# Patient Record
Sex: Female | Born: 1935 | Race: White | Hispanic: No
Health system: Southern US, Community
[De-identification: ages and names within clinical notes are randomized; demographics above are authoritative.]

---

## 2014-12-09 DIAGNOSIS — Z0000 Encounter for general adult medical examination without abnormal findings: Secondary | ICD-10-CM | POA: Diagnosis not present

## 2014-12-09 DIAGNOSIS — M8589 Other specified disorders of bone density and structure, multiple sites: Secondary | ICD-10-CM | POA: Diagnosis not present

## 2014-12-09 DIAGNOSIS — I10 Essential (primary) hypertension: Secondary | ICD-10-CM | POA: Diagnosis not present

## 2014-12-09 DIAGNOSIS — I70219 Atherosclerosis of native arteries of extremities with intermittent claudication, unspecified extremity: Secondary | ICD-10-CM | POA: Diagnosis not present

## 2015-04-04 DIAGNOSIS — D485 Neoplasm of uncertain behavior of skin: Secondary | ICD-10-CM | POA: Diagnosis not present

## 2015-04-04 DIAGNOSIS — C44529 Squamous cell carcinoma of skin of other part of trunk: Secondary | ICD-10-CM | POA: Diagnosis not present

## 2015-04-04 DIAGNOSIS — I10 Essential (primary) hypertension: Secondary | ICD-10-CM | POA: Diagnosis not present

## 2015-04-04 DIAGNOSIS — E780 Pure hypercholesterolemia: Secondary | ICD-10-CM | POA: Diagnosis not present

## 2015-04-11 DIAGNOSIS — H5210 Myopia, unspecified eye: Secondary | ICD-10-CM | POA: Diagnosis not present

## 2015-04-11 DIAGNOSIS — H524 Presbyopia: Secondary | ICD-10-CM | POA: Diagnosis not present

## 2015-04-11 DIAGNOSIS — H25019 Cortical age-related cataract, unspecified eye: Secondary | ICD-10-CM | POA: Diagnosis not present

## 2015-04-13 DIAGNOSIS — C44529 Squamous cell carcinoma of skin of other part of trunk: Secondary | ICD-10-CM | POA: Diagnosis not present

## 2015-04-13 DIAGNOSIS — L820 Inflamed seborrheic keratosis: Secondary | ICD-10-CM | POA: Diagnosis not present

## 2015-04-13 DIAGNOSIS — C44319 Basal cell carcinoma of skin of other parts of face: Secondary | ICD-10-CM | POA: Diagnosis not present

## 2015-04-13 DIAGNOSIS — L570 Actinic keratosis: Secondary | ICD-10-CM | POA: Diagnosis not present

## 2015-04-13 DIAGNOSIS — D485 Neoplasm of uncertain behavior of skin: Secondary | ICD-10-CM | POA: Diagnosis not present

## 2015-04-23 DIAGNOSIS — H5203 Hypermetropia, bilateral: Secondary | ICD-10-CM | POA: Diagnosis not present

## 2015-04-23 DIAGNOSIS — Z0100 Encounter for examination of eyes and vision without abnormal findings: Secondary | ICD-10-CM | POA: Diagnosis not present

## 2015-04-28 DIAGNOSIS — C44319 Basal cell carcinoma of skin of other parts of face: Secondary | ICD-10-CM | POA: Diagnosis not present

## 2015-05-05 DIAGNOSIS — L820 Inflamed seborrheic keratosis: Secondary | ICD-10-CM | POA: Diagnosis not present

## 2015-05-05 DIAGNOSIS — C44319 Basal cell carcinoma of skin of other parts of face: Secondary | ICD-10-CM | POA: Diagnosis not present

## 2015-08-25 DIAGNOSIS — I70219 Atherosclerosis of native arteries of extremities with intermittent claudication, unspecified extremity: Secondary | ICD-10-CM | POA: Diagnosis not present

## 2015-08-25 DIAGNOSIS — I4891 Unspecified atrial fibrillation: Secondary | ICD-10-CM | POA: Diagnosis not present

## 2015-08-25 DIAGNOSIS — E7800 Pure hypercholesterolemia, unspecified: Secondary | ICD-10-CM | POA: Diagnosis not present

## 2015-08-25 DIAGNOSIS — I10 Essential (primary) hypertension: Secondary | ICD-10-CM | POA: Diagnosis not present

## 2015-08-25 DIAGNOSIS — I499 Cardiac arrhythmia, unspecified: Secondary | ICD-10-CM | POA: Diagnosis not present

## 2015-08-26 DIAGNOSIS — I499 Cardiac arrhythmia, unspecified: Secondary | ICD-10-CM | POA: Diagnosis not present

## 2015-08-26 DIAGNOSIS — I4891 Unspecified atrial fibrillation: Secondary | ICD-10-CM | POA: Diagnosis not present

## 2015-08-26 DIAGNOSIS — I10 Essential (primary) hypertension: Secondary | ICD-10-CM | POA: Diagnosis not present

## 2015-08-26 DIAGNOSIS — E7800 Pure hypercholesterolemia, unspecified: Secondary | ICD-10-CM | POA: Diagnosis not present

## 2015-08-26 DIAGNOSIS — I70219 Atherosclerosis of native arteries of extremities with intermittent claudication, unspecified extremity: Secondary | ICD-10-CM | POA: Diagnosis not present

## 2015-10-21 DIAGNOSIS — H5203 Hypermetropia, bilateral: Secondary | ICD-10-CM | POA: Diagnosis not present

## 2015-10-21 DIAGNOSIS — Z0100 Encounter for examination of eyes and vision without abnormal findings: Secondary | ICD-10-CM | POA: Diagnosis not present

## 2015-11-09 DIAGNOSIS — H81312 Aural vertigo, left ear: Secondary | ICD-10-CM | POA: Diagnosis not present

## 2015-11-09 DIAGNOSIS — R42 Dizziness and giddiness: Secondary | ICD-10-CM | POA: Diagnosis not present

## 2015-12-15 DIAGNOSIS — M8589 Other specified disorders of bone density and structure, multiple sites: Secondary | ICD-10-CM | POA: Diagnosis not present

## 2015-12-15 DIAGNOSIS — Z0000 Encounter for general adult medical examination without abnormal findings: Secondary | ICD-10-CM | POA: Diagnosis not present

## 2015-12-15 DIAGNOSIS — E559 Vitamin D deficiency, unspecified: Secondary | ICD-10-CM | POA: Diagnosis not present

## 2015-12-15 DIAGNOSIS — E7800 Pure hypercholesterolemia, unspecified: Secondary | ICD-10-CM | POA: Diagnosis not present

## 2015-12-15 DIAGNOSIS — I739 Peripheral vascular disease, unspecified: Secondary | ICD-10-CM | POA: Diagnosis not present

## 2015-12-15 DIAGNOSIS — I4891 Unspecified atrial fibrillation: Secondary | ICD-10-CM | POA: Diagnosis not present

## 2015-12-15 DIAGNOSIS — I10 Essential (primary) hypertension: Secondary | ICD-10-CM | POA: Diagnosis not present

## 2016-04-05 DIAGNOSIS — I10 Essential (primary) hypertension: Secondary | ICD-10-CM | POA: Diagnosis not present

## 2016-04-05 DIAGNOSIS — I4891 Unspecified atrial fibrillation: Secondary | ICD-10-CM | POA: Diagnosis not present

## 2016-04-05 DIAGNOSIS — E7800 Pure hypercholesterolemia, unspecified: Secondary | ICD-10-CM | POA: Diagnosis not present

## 2016-04-05 DIAGNOSIS — E559 Vitamin D deficiency, unspecified: Secondary | ICD-10-CM | POA: Diagnosis not present

## 2016-04-10 DIAGNOSIS — R030 Elevated blood-pressure reading, without diagnosis of hypertension: Secondary | ICD-10-CM | POA: Diagnosis not present

## 2016-04-10 DIAGNOSIS — R040 Epistaxis: Secondary | ICD-10-CM | POA: Diagnosis not present

## 2016-04-10 DIAGNOSIS — I10 Essential (primary) hypertension: Secondary | ICD-10-CM | POA: Diagnosis not present

## 2016-04-13 DIAGNOSIS — R040 Epistaxis: Secondary | ICD-10-CM | POA: Diagnosis not present

## 2016-04-13 DIAGNOSIS — I10 Essential (primary) hypertension: Secondary | ICD-10-CM | POA: Diagnosis not present

## 2016-08-06 DIAGNOSIS — E7800 Pure hypercholesterolemia, unspecified: Secondary | ICD-10-CM | POA: Diagnosis not present

## 2016-08-06 DIAGNOSIS — I10 Essential (primary) hypertension: Secondary | ICD-10-CM | POA: Diagnosis not present

## 2016-08-06 DIAGNOSIS — Z79899 Other long term (current) drug therapy: Secondary | ICD-10-CM | POA: Diagnosis not present

## 2016-08-06 DIAGNOSIS — I4891 Unspecified atrial fibrillation: Secondary | ICD-10-CM | POA: Diagnosis not present

## 2016-12-31 DIAGNOSIS — H5203 Hypermetropia, bilateral: Secondary | ICD-10-CM | POA: Diagnosis not present

## 2017-01-16 DIAGNOSIS — I70203 Unspecified atherosclerosis of native arteries of extremities, bilateral legs: Secondary | ICD-10-CM | POA: Diagnosis not present

## 2017-01-16 DIAGNOSIS — E7800 Pure hypercholesterolemia, unspecified: Secondary | ICD-10-CM | POA: Diagnosis not present

## 2017-01-16 DIAGNOSIS — Z0000 Encounter for general adult medical examination without abnormal findings: Secondary | ICD-10-CM | POA: Diagnosis not present

## 2017-01-16 DIAGNOSIS — I4891 Unspecified atrial fibrillation: Secondary | ICD-10-CM | POA: Diagnosis not present

## 2017-01-16 DIAGNOSIS — Z79899 Other long term (current) drug therapy: Secondary | ICD-10-CM | POA: Diagnosis not present

## 2017-01-16 DIAGNOSIS — M8589 Other specified disorders of bone density and structure, multiple sites: Secondary | ICD-10-CM | POA: Diagnosis not present

## 2017-01-16 DIAGNOSIS — I10 Essential (primary) hypertension: Secondary | ICD-10-CM | POA: Diagnosis not present

## 2017-01-16 DIAGNOSIS — E559 Vitamin D deficiency, unspecified: Secondary | ICD-10-CM | POA: Diagnosis not present

## 2017-01-16 DIAGNOSIS — I739 Peripheral vascular disease, unspecified: Secondary | ICD-10-CM | POA: Diagnosis not present

## 2017-02-01 DIAGNOSIS — Z780 Asymptomatic menopausal state: Secondary | ICD-10-CM | POA: Diagnosis not present

## 2017-02-01 DIAGNOSIS — M85851 Other specified disorders of bone density and structure, right thigh: Secondary | ICD-10-CM | POA: Diagnosis not present

## 2017-08-27 DIAGNOSIS — I10 Essential (primary) hypertension: Secondary | ICD-10-CM | POA: Diagnosis not present

## 2017-08-27 DIAGNOSIS — I4891 Unspecified atrial fibrillation: Secondary | ICD-10-CM | POA: Diagnosis not present

## 2017-08-27 DIAGNOSIS — E7800 Pure hypercholesterolemia, unspecified: Secondary | ICD-10-CM | POA: Diagnosis not present

## 2017-08-27 DIAGNOSIS — Z79899 Other long term (current) drug therapy: Secondary | ICD-10-CM | POA: Diagnosis not present

## 2017-12-26 DIAGNOSIS — H524 Presbyopia: Secondary | ICD-10-CM | POA: Diagnosis not present

## 2017-12-26 DIAGNOSIS — H5203 Hypermetropia, bilateral: Secondary | ICD-10-CM | POA: Diagnosis not present

## 2018-05-20 DIAGNOSIS — G4700 Insomnia, unspecified: Secondary | ICD-10-CM | POA: Diagnosis not present

## 2018-05-20 DIAGNOSIS — I10 Essential (primary) hypertension: Secondary | ICD-10-CM | POA: Diagnosis not present

## 2018-05-20 DIAGNOSIS — E7800 Pure hypercholesterolemia, unspecified: Secondary | ICD-10-CM | POA: Diagnosis not present

## 2018-05-20 DIAGNOSIS — I4891 Unspecified atrial fibrillation: Secondary | ICD-10-CM | POA: Diagnosis not present

## 2018-05-20 DIAGNOSIS — Z23 Encounter for immunization: Secondary | ICD-10-CM | POA: Diagnosis not present

## 2018-07-02 DIAGNOSIS — Z0000 Encounter for general adult medical examination without abnormal findings: Secondary | ICD-10-CM | POA: Diagnosis not present

## 2018-07-02 DIAGNOSIS — Z2821 Immunization not carried out because of patient refusal: Secondary | ICD-10-CM | POA: Diagnosis not present

## 2018-07-02 DIAGNOSIS — I10 Essential (primary) hypertension: Secondary | ICD-10-CM | POA: Diagnosis not present

## 2018-07-02 DIAGNOSIS — I4891 Unspecified atrial fibrillation: Secondary | ICD-10-CM | POA: Diagnosis not present

## 2018-07-02 DIAGNOSIS — E7800 Pure hypercholesterolemia, unspecified: Secondary | ICD-10-CM | POA: Diagnosis not present

## 2018-07-02 DIAGNOSIS — I70203 Unspecified atherosclerosis of native arteries of extremities, bilateral legs: Secondary | ICD-10-CM | POA: Diagnosis not present

## 2018-07-02 DIAGNOSIS — Z6821 Body mass index (BMI) 21.0-21.9, adult: Secondary | ICD-10-CM | POA: Diagnosis not present

## 2018-10-02 DIAGNOSIS — I10 Essential (primary) hypertension: Secondary | ICD-10-CM | POA: Diagnosis not present

## 2018-10-25 DIAGNOSIS — H5203 Hypermetropia, bilateral: Secondary | ICD-10-CM | POA: Diagnosis not present

## 2019-02-03 DIAGNOSIS — F5101 Primary insomnia: Secondary | ICD-10-CM | POA: Diagnosis not present

## 2019-03-02 DIAGNOSIS — I70203 Unspecified atherosclerosis of native arteries of extremities, bilateral legs: Secondary | ICD-10-CM | POA: Diagnosis not present

## 2019-03-02 DIAGNOSIS — E7800 Pure hypercholesterolemia, unspecified: Secondary | ICD-10-CM | POA: Diagnosis not present

## 2019-03-02 DIAGNOSIS — Z6824 Body mass index (BMI) 24.0-24.9, adult: Secondary | ICD-10-CM | POA: Diagnosis not present

## 2019-03-02 DIAGNOSIS — I10 Essential (primary) hypertension: Secondary | ICD-10-CM | POA: Diagnosis not present

## 2019-03-02 DIAGNOSIS — D492 Neoplasm of unspecified behavior of bone, soft tissue, and skin: Secondary | ICD-10-CM | POA: Diagnosis not present

## 2019-03-02 DIAGNOSIS — F5101 Primary insomnia: Secondary | ICD-10-CM | POA: Diagnosis not present

## 2019-03-02 DIAGNOSIS — Z79899 Other long term (current) drug therapy: Secondary | ICD-10-CM | POA: Diagnosis not present

## 2019-04-09 DIAGNOSIS — H5203 Hypermetropia, bilateral: Secondary | ICD-10-CM | POA: Diagnosis not present

## 2019-05-01 DIAGNOSIS — Z03818 Encounter for observation for suspected exposure to other biological agents ruled out: Secondary | ICD-10-CM | POA: Diagnosis not present

## 2019-05-01 DIAGNOSIS — K529 Noninfective gastroenteritis and colitis, unspecified: Secondary | ICD-10-CM | POA: Diagnosis not present

## 2019-05-01 DIAGNOSIS — J8410 Pulmonary fibrosis, unspecified: Secondary | ICD-10-CM | POA: Diagnosis not present

## 2019-05-01 DIAGNOSIS — N838 Other noninflammatory disorders of ovary, fallopian tube and broad ligament: Secondary | ICD-10-CM | POA: Diagnosis not present

## 2019-05-01 DIAGNOSIS — R1084 Generalized abdominal pain: Secondary | ICD-10-CM | POA: Diagnosis not present

## 2019-05-01 DIAGNOSIS — K449 Diaphragmatic hernia without obstruction or gangrene: Secondary | ICD-10-CM | POA: Diagnosis not present

## 2019-05-05 DIAGNOSIS — Z6823 Body mass index (BMI) 23.0-23.9, adult: Secondary | ICD-10-CM | POA: Diagnosis not present

## 2019-05-05 DIAGNOSIS — R1084 Generalized abdominal pain: Secondary | ICD-10-CM | POA: Diagnosis not present

## 2019-07-08 DIAGNOSIS — I10 Essential (primary) hypertension: Secondary | ICD-10-CM | POA: Diagnosis not present

## 2019-07-08 DIAGNOSIS — E559 Vitamin D deficiency, unspecified: Secondary | ICD-10-CM | POA: Diagnosis not present

## 2019-07-08 DIAGNOSIS — I4891 Unspecified atrial fibrillation: Secondary | ICD-10-CM | POA: Diagnosis not present

## 2019-07-08 DIAGNOSIS — Z0000 Encounter for general adult medical examination without abnormal findings: Secondary | ICD-10-CM | POA: Diagnosis not present

## 2019-07-08 DIAGNOSIS — Z6823 Body mass index (BMI) 23.0-23.9, adult: Secondary | ICD-10-CM | POA: Diagnosis not present

## 2019-07-08 DIAGNOSIS — E7800 Pure hypercholesterolemia, unspecified: Secondary | ICD-10-CM | POA: Diagnosis not present

## 2019-07-08 DIAGNOSIS — Z2821 Immunization not carried out because of patient refusal: Secondary | ICD-10-CM | POA: Diagnosis not present

## 2019-07-13 DIAGNOSIS — M85851 Other specified disorders of bone density and structure, right thigh: Secondary | ICD-10-CM | POA: Diagnosis not present

## 2019-07-13 DIAGNOSIS — Z7983 Long term (current) use of bisphosphonates: Secondary | ICD-10-CM | POA: Diagnosis not present

## 2019-12-21 DIAGNOSIS — Z79899 Other long term (current) drug therapy: Secondary | ICD-10-CM | POA: Diagnosis not present

## 2019-12-21 DIAGNOSIS — F5101 Primary insomnia: Secondary | ICD-10-CM | POA: Diagnosis not present

## 2019-12-21 DIAGNOSIS — I70203 Unspecified atherosclerosis of native arteries of extremities, bilateral legs: Secondary | ICD-10-CM | POA: Diagnosis not present

## 2019-12-21 DIAGNOSIS — I10 Essential (primary) hypertension: Secondary | ICD-10-CM | POA: Diagnosis not present

## 2020-01-14 DIAGNOSIS — L03031 Cellulitis of right toe: Secondary | ICD-10-CM | POA: Diagnosis not present

## 2020-01-14 DIAGNOSIS — I739 Peripheral vascular disease, unspecified: Secondary | ICD-10-CM | POA: Diagnosis not present

## 2020-01-19 DIAGNOSIS — H5203 Hypermetropia, bilateral: Secondary | ICD-10-CM | POA: Diagnosis not present

## 2020-02-11 DIAGNOSIS — H5203 Hypermetropia, bilateral: Secondary | ICD-10-CM | POA: Diagnosis not present

## 2020-06-09 DIAGNOSIS — I517 Cardiomegaly: Secondary | ICD-10-CM | POA: Diagnosis not present

## 2020-06-09 DIAGNOSIS — I480 Paroxysmal atrial fibrillation: Secondary | ICD-10-CM | POA: Diagnosis not present

## 2020-06-09 DIAGNOSIS — E876 Hypokalemia: Secondary | ICD-10-CM | POA: Diagnosis not present

## 2020-06-09 DIAGNOSIS — I25118 Atherosclerotic heart disease of native coronary artery with other forms of angina pectoris: Secondary | ICD-10-CM | POA: Diagnosis not present

## 2020-06-09 DIAGNOSIS — I352 Nonrheumatic aortic (valve) stenosis with insufficiency: Secondary | ICD-10-CM | POA: Diagnosis not present

## 2020-06-09 DIAGNOSIS — I272 Pulmonary hypertension, unspecified: Secondary | ICD-10-CM | POA: Diagnosis not present

## 2020-06-09 DIAGNOSIS — E871 Hypo-osmolality and hyponatremia: Secondary | ICD-10-CM | POA: Diagnosis not present

## 2020-06-09 DIAGNOSIS — L02415 Cutaneous abscess of right lower limb: Secondary | ICD-10-CM | POA: Diagnosis not present

## 2020-06-09 DIAGNOSIS — A419 Sepsis, unspecified organism: Secondary | ICD-10-CM | POA: Diagnosis not present

## 2020-06-09 DIAGNOSIS — R600 Localized edema: Secondary | ICD-10-CM | POA: Diagnosis not present

## 2020-06-09 DIAGNOSIS — J811 Chronic pulmonary edema: Secondary | ICD-10-CM | POA: Diagnosis not present

## 2020-06-09 DIAGNOSIS — R7989 Other specified abnormal findings of blood chemistry: Secondary | ICD-10-CM | POA: Diagnosis not present

## 2020-06-09 DIAGNOSIS — I70201 Unspecified atherosclerosis of native arteries of extremities, right leg: Secondary | ICD-10-CM | POA: Diagnosis not present

## 2020-06-09 DIAGNOSIS — I70209 Unspecified atherosclerosis of native arteries of extremities, unspecified extremity: Secondary | ICD-10-CM | POA: Diagnosis not present

## 2020-06-09 DIAGNOSIS — M7989 Other specified soft tissue disorders: Secondary | ICD-10-CM | POA: Diagnosis not present

## 2020-06-09 DIAGNOSIS — M79673 Pain in unspecified foot: Secondary | ICD-10-CM | POA: Diagnosis not present

## 2020-06-09 DIAGNOSIS — I4891 Unspecified atrial fibrillation: Secondary | ICD-10-CM | POA: Diagnosis not present

## 2020-06-09 DIAGNOSIS — M1711 Unilateral primary osteoarthritis, right knee: Secondary | ICD-10-CM | POA: Diagnosis not present

## 2020-06-09 DIAGNOSIS — R0989 Other specified symptoms and signs involving the circulatory and respiratory systems: Secondary | ICD-10-CM | POA: Diagnosis not present

## 2020-06-09 DIAGNOSIS — Z79899 Other long term (current) drug therapy: Secondary | ICD-10-CM | POA: Diagnosis not present

## 2020-06-09 DIAGNOSIS — I10 Essential (primary) hypertension: Secondary | ICD-10-CM | POA: Diagnosis not present

## 2020-06-09 DIAGNOSIS — L03115 Cellulitis of right lower limb: Secondary | ICD-10-CM | POA: Diagnosis not present

## 2020-06-14 DIAGNOSIS — I480 Paroxysmal atrial fibrillation: Secondary | ICD-10-CM | POA: Diagnosis not present

## 2020-06-17 DIAGNOSIS — I739 Peripheral vascular disease, unspecified: Secondary | ICD-10-CM | POA: Diagnosis not present

## 2020-06-17 DIAGNOSIS — S8012XA Contusion of left lower leg, initial encounter: Secondary | ICD-10-CM | POA: Diagnosis not present

## 2020-06-17 DIAGNOSIS — I482 Chronic atrial fibrillation, unspecified: Secondary | ICD-10-CM | POA: Diagnosis not present

## 2020-06-21 DIAGNOSIS — S8012XA Contusion of left lower leg, initial encounter: Secondary | ICD-10-CM | POA: Diagnosis not present

## 2020-06-23 DIAGNOSIS — L97829 Non-pressure chronic ulcer of other part of left lower leg with unspecified severity: Secondary | ICD-10-CM | POA: Diagnosis not present

## 2020-06-23 DIAGNOSIS — S81812A Laceration without foreign body, left lower leg, initial encounter: Secondary | ICD-10-CM | POA: Diagnosis not present

## 2020-06-23 DIAGNOSIS — S8012XA Contusion of left lower leg, initial encounter: Secondary | ICD-10-CM | POA: Diagnosis not present

## 2020-06-23 DIAGNOSIS — D649 Anemia, unspecified: Secondary | ICD-10-CM | POA: Diagnosis not present

## 2020-06-28 DIAGNOSIS — D649 Anemia, unspecified: Secondary | ICD-10-CM | POA: Diagnosis not present

## 2020-06-28 DIAGNOSIS — D638 Anemia in other chronic diseases classified elsewhere: Secondary | ICD-10-CM | POA: Diagnosis not present

## 2020-07-01 DIAGNOSIS — D649 Anemia, unspecified: Secondary | ICD-10-CM | POA: Diagnosis not present

## 2020-07-07 DIAGNOSIS — I70229 Atherosclerosis of native arteries of extremities with rest pain, unspecified extremity: Secondary | ICD-10-CM | POA: Diagnosis not present

## 2020-07-09 DIAGNOSIS — M79671 Pain in right foot: Secondary | ICD-10-CM | POA: Diagnosis not present

## 2020-07-09 DIAGNOSIS — L819 Disorder of pigmentation, unspecified: Secondary | ICD-10-CM | POA: Diagnosis not present

## 2020-07-09 DIAGNOSIS — L03115 Cellulitis of right lower limb: Secondary | ICD-10-CM | POA: Diagnosis not present

## 2020-07-09 DIAGNOSIS — I739 Peripheral vascular disease, unspecified: Secondary | ICD-10-CM | POA: Diagnosis not present

## 2020-07-09 DIAGNOSIS — R2241 Localized swelling, mass and lump, right lower limb: Secondary | ICD-10-CM | POA: Diagnosis not present

## 2020-07-11 DIAGNOSIS — I739 Peripheral vascular disease, unspecified: Secondary | ICD-10-CM | POA: Diagnosis not present

## 2020-07-11 DIAGNOSIS — Z0000 Encounter for general adult medical examination without abnormal findings: Secondary | ICD-10-CM | POA: Diagnosis not present

## 2020-07-11 DIAGNOSIS — Z6823 Body mass index (BMI) 23.0-23.9, adult: Secondary | ICD-10-CM | POA: Diagnosis not present

## 2020-07-11 DIAGNOSIS — I482 Chronic atrial fibrillation, unspecified: Secondary | ICD-10-CM | POA: Diagnosis not present

## 2020-07-11 DIAGNOSIS — Z2821 Immunization not carried out because of patient refusal: Secondary | ICD-10-CM | POA: Diagnosis not present

## 2020-07-11 DIAGNOSIS — E7800 Pure hypercholesterolemia, unspecified: Secondary | ICD-10-CM | POA: Diagnosis not present

## 2020-07-11 DIAGNOSIS — I70203 Unspecified atherosclerosis of native arteries of extremities, bilateral legs: Secondary | ICD-10-CM | POA: Diagnosis not present

## 2020-07-11 DIAGNOSIS — I10 Essential (primary) hypertension: Secondary | ICD-10-CM | POA: Diagnosis not present

## 2020-07-11 DIAGNOSIS — G4700 Insomnia, unspecified: Secondary | ICD-10-CM | POA: Diagnosis not present

## 2020-07-12 ENCOUNTER — Encounter: Payer: Self-pay | Admitting: General Practice

## 2020-07-12 DIAGNOSIS — I70229 Atherosclerosis of native arteries of extremities with rest pain, unspecified extremity: Secondary | ICD-10-CM | POA: Diagnosis not present

## 2020-07-18 DIAGNOSIS — I4891 Unspecified atrial fibrillation: Secondary | ICD-10-CM | POA: Diagnosis not present

## 2020-07-18 DIAGNOSIS — M7989 Other specified soft tissue disorders: Secondary | ICD-10-CM | POA: Diagnosis not present

## 2020-07-18 DIAGNOSIS — Z7901 Long term (current) use of anticoagulants: Secondary | ICD-10-CM | POA: Diagnosis not present

## 2020-07-18 DIAGNOSIS — E785 Hyperlipidemia, unspecified: Secondary | ICD-10-CM | POA: Diagnosis not present

## 2020-07-18 DIAGNOSIS — Z86718 Personal history of other venous thrombosis and embolism: Secondary | ICD-10-CM | POA: Diagnosis not present

## 2020-07-18 DIAGNOSIS — I70221 Atherosclerosis of native arteries of extremities with rest pain, right leg: Secondary | ICD-10-CM | POA: Diagnosis not present

## 2020-07-18 DIAGNOSIS — I10 Essential (primary) hypertension: Secondary | ICD-10-CM | POA: Diagnosis not present

## 2020-07-18 DIAGNOSIS — J449 Chronic obstructive pulmonary disease, unspecified: Secondary | ICD-10-CM | POA: Diagnosis not present

## 2020-08-05 DIAGNOSIS — Z95820 Peripheral vascular angioplasty status with implants and grafts: Secondary | ICD-10-CM | POA: Diagnosis not present

## 2020-08-05 DIAGNOSIS — I70223 Atherosclerosis of native arteries of extremities with rest pain, bilateral legs: Secondary | ICD-10-CM | POA: Diagnosis not present

## 2020-08-05 DIAGNOSIS — I739 Peripheral vascular disease, unspecified: Secondary | ICD-10-CM | POA: Diagnosis not present

## 2020-08-09 DIAGNOSIS — I70235 Atherosclerosis of native arteries of right leg with ulceration of other part of foot: Secondary | ICD-10-CM | POA: Diagnosis not present

## 2020-08-09 DIAGNOSIS — I70219 Atherosclerosis of native arteries of extremities with intermittent claudication, unspecified extremity: Secondary | ICD-10-CM | POA: Diagnosis not present

## 2020-08-21 DIAGNOSIS — D500 Iron deficiency anemia secondary to blood loss (chronic): Secondary | ICD-10-CM | POA: Diagnosis not present

## 2020-08-21 DIAGNOSIS — K222 Esophageal obstruction: Secondary | ICD-10-CM | POA: Diagnosis not present

## 2020-08-21 DIAGNOSIS — I70261 Atherosclerosis of native arteries of extremities with gangrene, right leg: Secondary | ICD-10-CM | POA: Diagnosis not present

## 2020-08-21 DIAGNOSIS — K259 Gastric ulcer, unspecified as acute or chronic, without hemorrhage or perforation: Secondary | ICD-10-CM | POA: Diagnosis not present

## 2020-08-21 DIAGNOSIS — Z9889 Other specified postprocedural states: Secondary | ICD-10-CM | POA: Diagnosis not present

## 2020-08-21 DIAGNOSIS — M2550 Pain in unspecified joint: Secondary | ICD-10-CM | POA: Diagnosis not present

## 2020-08-21 DIAGNOSIS — K626 Ulcer of anus and rectum: Secondary | ICD-10-CM | POA: Diagnosis not present

## 2020-08-21 DIAGNOSIS — I739 Peripheral vascular disease, unspecified: Secondary | ICD-10-CM | POA: Diagnosis not present

## 2020-08-21 DIAGNOSIS — K922 Gastrointestinal hemorrhage, unspecified: Secondary | ICD-10-CM | POA: Diagnosis not present

## 2020-08-21 DIAGNOSIS — J90 Pleural effusion, not elsewhere classified: Secondary | ICD-10-CM | POA: Diagnosis not present

## 2020-08-21 DIAGNOSIS — I96 Gangrene, not elsewhere classified: Secondary | ICD-10-CM | POA: Diagnosis not present

## 2020-08-21 DIAGNOSIS — K635 Polyp of colon: Secondary | ICD-10-CM | POA: Diagnosis not present

## 2020-08-21 DIAGNOSIS — D649 Anemia, unspecified: Secondary | ICD-10-CM | POA: Diagnosis not present

## 2020-08-21 DIAGNOSIS — D62 Acute posthemorrhagic anemia: Secondary | ICD-10-CM | POA: Diagnosis not present

## 2020-08-21 DIAGNOSIS — I509 Heart failure, unspecified: Secondary | ICD-10-CM | POA: Diagnosis not present

## 2020-08-21 DIAGNOSIS — J189 Pneumonia, unspecified organism: Secondary | ICD-10-CM | POA: Diagnosis not present

## 2020-08-21 DIAGNOSIS — K5731 Diverticulosis of large intestine without perforation or abscess with bleeding: Secondary | ICD-10-CM | POA: Diagnosis not present

## 2020-08-21 DIAGNOSIS — Z7401 Bed confinement status: Secondary | ICD-10-CM | POA: Diagnosis not present

## 2020-08-21 DIAGNOSIS — I70268 Atherosclerosis of native arteries of extremities with gangrene, other extremity: Secondary | ICD-10-CM | POA: Diagnosis not present

## 2020-08-21 DIAGNOSIS — R58 Hemorrhage, not elsewhere classified: Secondary | ICD-10-CM | POA: Diagnosis not present

## 2020-08-21 DIAGNOSIS — J9601 Acute respiratory failure with hypoxia: Secondary | ICD-10-CM | POA: Diagnosis not present

## 2020-08-21 DIAGNOSIS — I10 Essential (primary) hypertension: Secondary | ICD-10-CM | POA: Diagnosis not present

## 2020-08-21 DIAGNOSIS — I959 Hypotension, unspecified: Secondary | ICD-10-CM | POA: Diagnosis not present

## 2020-08-21 DIAGNOSIS — D126 Benign neoplasm of colon, unspecified: Secondary | ICD-10-CM | POA: Diagnosis not present

## 2020-08-21 DIAGNOSIS — R531 Weakness: Secondary | ICD-10-CM | POA: Diagnosis not present

## 2020-08-21 DIAGNOSIS — K921 Melena: Secondary | ICD-10-CM | POA: Diagnosis not present

## 2020-08-21 DIAGNOSIS — E871 Hypo-osmolality and hyponatremia: Secondary | ICD-10-CM | POA: Diagnosis not present

## 2020-08-21 DIAGNOSIS — R0902 Hypoxemia: Secondary | ICD-10-CM | POA: Diagnosis not present

## 2020-08-21 DIAGNOSIS — K2970 Gastritis, unspecified, without bleeding: Secondary | ICD-10-CM | POA: Diagnosis not present

## 2020-08-21 DIAGNOSIS — I4891 Unspecified atrial fibrillation: Secondary | ICD-10-CM | POA: Diagnosis not present

## 2020-08-21 DIAGNOSIS — I70269 Atherosclerosis of native arteries of extremities with gangrene, unspecified extremity: Secondary | ICD-10-CM | POA: Diagnosis not present

## 2020-08-21 DIAGNOSIS — K5730 Diverticulosis of large intestine without perforation or abscess without bleeding: Secondary | ICD-10-CM | POA: Diagnosis not present

## 2020-08-22 DIAGNOSIS — I96 Gangrene, not elsewhere classified: Secondary | ICD-10-CM | POA: Diagnosis not present

## 2020-08-22 DIAGNOSIS — J189 Pneumonia, unspecified organism: Secondary | ICD-10-CM | POA: Diagnosis not present

## 2020-08-22 DIAGNOSIS — D62 Acute posthemorrhagic anemia: Secondary | ICD-10-CM | POA: Diagnosis not present

## 2020-08-22 DIAGNOSIS — K222 Esophageal obstruction: Secondary | ICD-10-CM | POA: Diagnosis not present

## 2020-08-22 DIAGNOSIS — K921 Melena: Secondary | ICD-10-CM | POA: Diagnosis not present

## 2020-08-22 DIAGNOSIS — D126 Benign neoplasm of colon, unspecified: Secondary | ICD-10-CM | POA: Diagnosis not present

## 2020-08-22 DIAGNOSIS — K5730 Diverticulosis of large intestine without perforation or abscess without bleeding: Secondary | ICD-10-CM | POA: Diagnosis not present

## 2020-08-22 DIAGNOSIS — K2970 Gastritis, unspecified, without bleeding: Secondary | ICD-10-CM | POA: Diagnosis not present

## 2020-08-22 DIAGNOSIS — K5731 Diverticulosis of large intestine without perforation or abscess with bleeding: Secondary | ICD-10-CM | POA: Diagnosis not present

## 2020-08-22 DIAGNOSIS — D500 Iron deficiency anemia secondary to blood loss (chronic): Secondary | ICD-10-CM | POA: Diagnosis not present

## 2020-08-23 DIAGNOSIS — I96 Gangrene, not elsewhere classified: Secondary | ICD-10-CM | POA: Diagnosis not present

## 2020-08-23 DIAGNOSIS — K259 Gastric ulcer, unspecified as acute or chronic, without hemorrhage or perforation: Secondary | ICD-10-CM | POA: Diagnosis not present

## 2020-08-23 DIAGNOSIS — D500 Iron deficiency anemia secondary to blood loss (chronic): Secondary | ICD-10-CM | POA: Diagnosis not present

## 2020-08-23 DIAGNOSIS — K5730 Diverticulosis of large intestine without perforation or abscess without bleeding: Secondary | ICD-10-CM | POA: Diagnosis not present

## 2020-08-23 DIAGNOSIS — D126 Benign neoplasm of colon, unspecified: Secondary | ICD-10-CM | POA: Diagnosis not present

## 2020-08-23 DIAGNOSIS — K921 Melena: Secondary | ICD-10-CM | POA: Diagnosis not present

## 2020-08-23 DIAGNOSIS — K2970 Gastritis, unspecified, without bleeding: Secondary | ICD-10-CM | POA: Diagnosis not present

## 2020-08-23 DIAGNOSIS — K5731 Diverticulosis of large intestine without perforation or abscess with bleeding: Secondary | ICD-10-CM | POA: Diagnosis not present

## 2020-08-23 DIAGNOSIS — I10 Essential (primary) hypertension: Secondary | ICD-10-CM | POA: Diagnosis not present

## 2020-08-23 DIAGNOSIS — J189 Pneumonia, unspecified organism: Secondary | ICD-10-CM | POA: Diagnosis not present

## 2020-08-23 DIAGNOSIS — D62 Acute posthemorrhagic anemia: Secondary | ICD-10-CM | POA: Diagnosis not present

## 2020-08-23 DIAGNOSIS — K222 Esophageal obstruction: Secondary | ICD-10-CM | POA: Diagnosis not present

## 2020-08-24 DIAGNOSIS — K2970 Gastritis, unspecified, without bleeding: Secondary | ICD-10-CM | POA: Diagnosis not present

## 2020-08-24 DIAGNOSIS — K5730 Diverticulosis of large intestine without perforation or abscess without bleeding: Secondary | ICD-10-CM | POA: Diagnosis not present

## 2020-08-24 DIAGNOSIS — D62 Acute posthemorrhagic anemia: Secondary | ICD-10-CM | POA: Diagnosis not present

## 2020-08-24 DIAGNOSIS — K921 Melena: Secondary | ICD-10-CM | POA: Diagnosis not present

## 2020-08-24 DIAGNOSIS — K222 Esophageal obstruction: Secondary | ICD-10-CM | POA: Diagnosis not present

## 2020-08-24 DIAGNOSIS — K5731 Diverticulosis of large intestine without perforation or abscess with bleeding: Secondary | ICD-10-CM | POA: Diagnosis not present

## 2020-08-24 DIAGNOSIS — I96 Gangrene, not elsewhere classified: Secondary | ICD-10-CM | POA: Diagnosis not present

## 2020-08-24 DIAGNOSIS — D126 Benign neoplasm of colon, unspecified: Secondary | ICD-10-CM | POA: Diagnosis not present

## 2020-08-24 DIAGNOSIS — D500 Iron deficiency anemia secondary to blood loss (chronic): Secondary | ICD-10-CM | POA: Diagnosis not present

## 2020-08-24 DIAGNOSIS — J189 Pneumonia, unspecified organism: Secondary | ICD-10-CM | POA: Diagnosis not present

## 2020-08-25 DIAGNOSIS — J90 Pleural effusion, not elsewhere classified: Secondary | ICD-10-CM | POA: Diagnosis not present

## 2020-08-25 DIAGNOSIS — K921 Melena: Secondary | ICD-10-CM | POA: Diagnosis not present

## 2020-08-25 DIAGNOSIS — D500 Iron deficiency anemia secondary to blood loss (chronic): Secondary | ICD-10-CM | POA: Diagnosis not present

## 2020-08-25 DIAGNOSIS — K2970 Gastritis, unspecified, without bleeding: Secondary | ICD-10-CM | POA: Diagnosis not present

## 2020-08-25 DIAGNOSIS — J189 Pneumonia, unspecified organism: Secondary | ICD-10-CM | POA: Diagnosis not present

## 2020-08-25 DIAGNOSIS — K5730 Diverticulosis of large intestine without perforation or abscess without bleeding: Secondary | ICD-10-CM | POA: Diagnosis not present

## 2020-08-25 DIAGNOSIS — K222 Esophageal obstruction: Secondary | ICD-10-CM | POA: Diagnosis not present

## 2020-08-25 DIAGNOSIS — I96 Gangrene, not elsewhere classified: Secondary | ICD-10-CM | POA: Diagnosis not present

## 2020-08-25 DIAGNOSIS — D126 Benign neoplasm of colon, unspecified: Secondary | ICD-10-CM | POA: Diagnosis not present

## 2020-08-25 DIAGNOSIS — K5731 Diverticulosis of large intestine without perforation or abscess with bleeding: Secondary | ICD-10-CM | POA: Diagnosis not present

## 2020-08-25 DIAGNOSIS — D62 Acute posthemorrhagic anemia: Secondary | ICD-10-CM | POA: Diagnosis not present

## 2020-08-26 DIAGNOSIS — K2970 Gastritis, unspecified, without bleeding: Secondary | ICD-10-CM | POA: Diagnosis not present

## 2020-08-26 DIAGNOSIS — I96 Gangrene, not elsewhere classified: Secondary | ICD-10-CM | POA: Diagnosis not present

## 2020-08-26 DIAGNOSIS — K921 Melena: Secondary | ICD-10-CM | POA: Diagnosis not present

## 2020-08-26 DIAGNOSIS — D500 Iron deficiency anemia secondary to blood loss (chronic): Secondary | ICD-10-CM | POA: Diagnosis not present

## 2020-08-26 DIAGNOSIS — K5730 Diverticulosis of large intestine without perforation or abscess without bleeding: Secondary | ICD-10-CM | POA: Diagnosis not present

## 2020-08-26 DIAGNOSIS — K5731 Diverticulosis of large intestine without perforation or abscess with bleeding: Secondary | ICD-10-CM | POA: Diagnosis not present

## 2020-08-26 DIAGNOSIS — K222 Esophageal obstruction: Secondary | ICD-10-CM | POA: Diagnosis not present

## 2020-08-26 DIAGNOSIS — D62 Acute posthemorrhagic anemia: Secondary | ICD-10-CM | POA: Diagnosis not present

## 2020-08-26 DIAGNOSIS — D126 Benign neoplasm of colon, unspecified: Secondary | ICD-10-CM | POA: Diagnosis not present

## 2020-08-26 DIAGNOSIS — J189 Pneumonia, unspecified organism: Secondary | ICD-10-CM | POA: Diagnosis not present

## 2020-08-27 DIAGNOSIS — D500 Iron deficiency anemia secondary to blood loss (chronic): Secondary | ICD-10-CM | POA: Diagnosis not present

## 2020-08-27 DIAGNOSIS — K5730 Diverticulosis of large intestine without perforation or abscess without bleeding: Secondary | ICD-10-CM | POA: Diagnosis not present

## 2020-08-27 DIAGNOSIS — K2970 Gastritis, unspecified, without bleeding: Secondary | ICD-10-CM | POA: Diagnosis not present

## 2020-08-27 DIAGNOSIS — I96 Gangrene, not elsewhere classified: Secondary | ICD-10-CM | POA: Diagnosis not present

## 2020-08-27 DIAGNOSIS — J189 Pneumonia, unspecified organism: Secondary | ICD-10-CM | POA: Diagnosis not present

## 2020-08-27 DIAGNOSIS — K921 Melena: Secondary | ICD-10-CM | POA: Diagnosis not present

## 2020-08-27 DIAGNOSIS — K222 Esophageal obstruction: Secondary | ICD-10-CM | POA: Diagnosis not present

## 2020-08-27 DIAGNOSIS — D126 Benign neoplasm of colon, unspecified: Secondary | ICD-10-CM | POA: Diagnosis not present

## 2020-08-27 DIAGNOSIS — K5731 Diverticulosis of large intestine without perforation or abscess with bleeding: Secondary | ICD-10-CM | POA: Diagnosis not present

## 2020-08-27 DIAGNOSIS — D62 Acute posthemorrhagic anemia: Secondary | ICD-10-CM | POA: Diagnosis not present

## 2020-08-28 DIAGNOSIS — D62 Acute posthemorrhagic anemia: Secondary | ICD-10-CM | POA: Diagnosis not present

## 2020-08-28 DIAGNOSIS — K5731 Diverticulosis of large intestine without perforation or abscess with bleeding: Secondary | ICD-10-CM | POA: Diagnosis not present

## 2020-08-28 DIAGNOSIS — J189 Pneumonia, unspecified organism: Secondary | ICD-10-CM | POA: Diagnosis not present

## 2020-08-29 DIAGNOSIS — K5731 Diverticulosis of large intestine without perforation or abscess with bleeding: Secondary | ICD-10-CM | POA: Diagnosis not present

## 2020-08-29 DIAGNOSIS — D62 Acute posthemorrhagic anemia: Secondary | ICD-10-CM | POA: Diagnosis not present

## 2020-08-29 DIAGNOSIS — J189 Pneumonia, unspecified organism: Secondary | ICD-10-CM | POA: Diagnosis not present

## 2020-08-30 DIAGNOSIS — D62 Acute posthemorrhagic anemia: Secondary | ICD-10-CM | POA: Diagnosis not present

## 2020-08-30 DIAGNOSIS — J189 Pneumonia, unspecified organism: Secondary | ICD-10-CM | POA: Diagnosis not present

## 2020-08-30 DIAGNOSIS — K5731 Diverticulosis of large intestine without perforation or abscess with bleeding: Secondary | ICD-10-CM | POA: Diagnosis not present

## 2020-08-31 DIAGNOSIS — D62 Acute posthemorrhagic anemia: Secondary | ICD-10-CM | POA: Diagnosis not present

## 2020-08-31 DIAGNOSIS — K5731 Diverticulosis of large intestine without perforation or abscess with bleeding: Secondary | ICD-10-CM | POA: Diagnosis not present

## 2020-08-31 DIAGNOSIS — I96 Gangrene, not elsewhere classified: Secondary | ICD-10-CM | POA: Diagnosis not present

## 2020-08-31 DIAGNOSIS — I739 Peripheral vascular disease, unspecified: Secondary | ICD-10-CM | POA: Diagnosis not present

## 2020-08-31 DIAGNOSIS — I70269 Atherosclerosis of native arteries of extremities with gangrene, unspecified extremity: Secondary | ICD-10-CM | POA: Diagnosis not present

## 2020-08-31 DIAGNOSIS — J189 Pneumonia, unspecified organism: Secondary | ICD-10-CM | POA: Diagnosis not present

## 2020-09-01 ENCOUNTER — Other Ambulatory Visit: Payer: Self-pay | Admitting: Interventional Radiology

## 2020-09-01 DIAGNOSIS — I70269 Atherosclerosis of native arteries of extremities with gangrene, unspecified extremity: Secondary | ICD-10-CM | POA: Diagnosis not present

## 2020-09-01 DIAGNOSIS — D62 Acute posthemorrhagic anemia: Secondary | ICD-10-CM | POA: Diagnosis not present

## 2020-09-01 DIAGNOSIS — J189 Pneumonia, unspecified organism: Secondary | ICD-10-CM | POA: Diagnosis not present

## 2020-09-01 DIAGNOSIS — Z9889 Other specified postprocedural states: Secondary | ICD-10-CM | POA: Diagnosis not present

## 2020-09-01 DIAGNOSIS — K5731 Diverticulosis of large intestine without perforation or abscess with bleeding: Secondary | ICD-10-CM | POA: Diagnosis not present

## 2020-09-01 DIAGNOSIS — I70261 Atherosclerosis of native arteries of extremities with gangrene, right leg: Secondary | ICD-10-CM | POA: Diagnosis not present

## 2020-09-02 DIAGNOSIS — D62 Acute posthemorrhagic anemia: Secondary | ICD-10-CM | POA: Diagnosis not present

## 2020-09-02 DIAGNOSIS — I70269 Atherosclerosis of native arteries of extremities with gangrene, unspecified extremity: Secondary | ICD-10-CM | POA: Diagnosis not present

## 2020-09-02 DIAGNOSIS — I509 Heart failure, unspecified: Secondary | ICD-10-CM | POA: Diagnosis not present

## 2020-09-02 DIAGNOSIS — K5731 Diverticulosis of large intestine without perforation or abscess with bleeding: Secondary | ICD-10-CM | POA: Diagnosis not present

## 2020-09-02 DIAGNOSIS — J189 Pneumonia, unspecified organism: Secondary | ICD-10-CM | POA: Diagnosis not present

## 2020-09-03 DIAGNOSIS — R531 Weakness: Secondary | ICD-10-CM | POA: Diagnosis not present

## 2020-09-03 DIAGNOSIS — R58 Hemorrhage, not elsewhere classified: Secondary | ICD-10-CM | POA: Diagnosis not present

## 2020-09-03 DIAGNOSIS — M2550 Pain in unspecified joint: Secondary | ICD-10-CM | POA: Diagnosis not present

## 2020-09-03 DIAGNOSIS — Z7401 Bed confinement status: Secondary | ICD-10-CM | POA: Diagnosis not present

## 2020-09-07 ENCOUNTER — Other Ambulatory Visit (HOSPITAL_COMMUNITY): Payer: Self-pay | Admitting: Interventional Radiology

## 2020-09-07 DIAGNOSIS — I96 Gangrene, not elsewhere classified: Secondary | ICD-10-CM

## 2020-09-08 ENCOUNTER — Other Ambulatory Visit: Payer: Self-pay | Admitting: Radiology

## 2020-09-08 DIAGNOSIS — E871 Hypo-osmolality and hyponatremia: Secondary | ICD-10-CM | POA: Diagnosis not present

## 2020-09-08 DIAGNOSIS — F1190 Opioid use, unspecified, uncomplicated: Secondary | ICD-10-CM | POA: Diagnosis not present

## 2020-09-08 DIAGNOSIS — I70223 Atherosclerosis of native arteries of extremities with rest pain, bilateral legs: Secondary | ICD-10-CM | POA: Diagnosis not present

## 2020-09-08 DIAGNOSIS — I96 Gangrene, not elsewhere classified: Secondary | ICD-10-CM | POA: Diagnosis not present

## 2020-09-08 DIAGNOSIS — R600 Localized edema: Secondary | ICD-10-CM | POA: Diagnosis not present

## 2020-09-08 DIAGNOSIS — I70229 Atherosclerosis of native arteries of extremities with rest pain, unspecified extremity: Secondary | ICD-10-CM | POA: Diagnosis not present

## 2020-09-09 ENCOUNTER — Ambulatory Visit (HOSPITAL_COMMUNITY): Admission: RE | Admit: 2020-09-09 | Payer: Medicare HMO | Source: Ambulatory Visit

## 2020-09-13 DIAGNOSIS — R700 Elevated erythrocyte sedimentation rate: Secondary | ICD-10-CM | POA: Diagnosis not present

## 2020-09-13 DIAGNOSIS — R600 Localized edema: Secondary | ICD-10-CM | POA: Diagnosis not present

## 2020-09-13 DIAGNOSIS — D649 Anemia, unspecified: Secondary | ICD-10-CM | POA: Diagnosis not present

## 2020-09-15 ENCOUNTER — Other Ambulatory Visit: Payer: Self-pay | Admitting: Radiology

## 2020-09-16 ENCOUNTER — Telehealth (HOSPITAL_COMMUNITY): Payer: Self-pay | Admitting: *Deleted

## 2020-09-16 ENCOUNTER — Other Ambulatory Visit: Payer: Self-pay

## 2020-09-16 ENCOUNTER — Ambulatory Visit (HOSPITAL_COMMUNITY)
Admission: RE | Admit: 2020-09-16 | Discharge: 2020-09-16 | Disposition: A | Discharge status: Home / Self Care | Payer: Medicare HMO | Source: Ambulatory Visit | Attending: Interventional Radiology | Admitting: Interventional Radiology

## 2020-09-16 ENCOUNTER — Other Ambulatory Visit (HOSPITAL_COMMUNITY): Payer: Self-pay | Admitting: Interventional Radiology

## 2020-09-16 DIAGNOSIS — I7092 Chronic total occlusion of artery of the extremities: Secondary | ICD-10-CM | POA: Insufficient documentation

## 2020-09-16 DIAGNOSIS — L97519 Non-pressure chronic ulcer of other part of right foot with unspecified severity: Secondary | ICD-10-CM | POA: Diagnosis not present

## 2020-09-16 DIAGNOSIS — I70261 Atherosclerosis of native arteries of extremities with gangrene, right leg: Secondary | ICD-10-CM | POA: Insufficient documentation

## 2020-09-16 DIAGNOSIS — Z79899 Other long term (current) drug therapy: Secondary | ICD-10-CM | POA: Insufficient documentation

## 2020-09-16 DIAGNOSIS — I96 Gangrene, not elsewhere classified: Secondary | ICD-10-CM

## 2020-09-16 DIAGNOSIS — J449 Chronic obstructive pulmonary disease, unspecified: Secondary | ICD-10-CM | POA: Diagnosis not present

## 2020-09-16 DIAGNOSIS — I10 Essential (primary) hypertension: Secondary | ICD-10-CM | POA: Diagnosis not present

## 2020-09-16 DIAGNOSIS — I4891 Unspecified atrial fibrillation: Secondary | ICD-10-CM | POA: Insufficient documentation

## 2020-09-16 DIAGNOSIS — E785 Hyperlipidemia, unspecified: Secondary | ICD-10-CM | POA: Insufficient documentation

## 2020-09-16 HISTORY — PX: IR TIB-PERO ART PTA MOD SED: IMG2313

## 2020-09-16 HISTORY — PX: IR US GUIDE VASC ACCESS RIGHT: IMG2390

## 2020-09-16 HISTORY — PX: IR ANGIOGRAM EXTREMITY RIGHT: IMG652

## 2020-09-16 HISTORY — PX: IR ANGIOGRAM PELVIS SELECTIVE OR SUPRASELECTIVE: IMG661

## 2020-09-16 HISTORY — PX: IR US GUIDE VASC ACCESS LEFT: IMG2389

## 2020-09-16 HISTORY — PX: IR FEM POP ART STENT INC PTA MOD SED: IMG2311

## 2020-09-16 HISTORY — PX: IR FEM POP ART PTA MOD SED: IMG2309

## 2020-09-16 LAB — PROTIME-INR
INR: 1 (ref 0.8–1.2)
Prothrombin Time: 12.8 seconds (ref 11.4–15.2)

## 2020-09-16 LAB — BASIC METABOLIC PANEL
Anion gap: 14 (ref 5–15)
BUN: 11 mg/dL (ref 8–23)
CO2: 26 mmol/L (ref 22–32)
Calcium: 9.7 mg/dL (ref 8.9–10.3)
Chloride: 91 mmol/L — ABNORMAL LOW (ref 98–111)
Creatinine, Ser: 1.2 mg/dL — ABNORMAL HIGH (ref 0.44–1.00)
GFR, Estimated: 42 mL/min — ABNORMAL LOW (ref >60)
Glucose, Bld: 95 mg/dL (ref 70–99)
Potassium: 4.7 mmol/L (ref 3.5–5.1)
Sodium: 131 mmol/L — ABNORMAL LOW (ref 135–145)

## 2020-09-16 LAB — POCT ACTIVATED CLOTTING TIME
Activated Clotting Time: 214 seconds
Activated Clotting Time: 214 seconds

## 2020-09-16 LAB — CBC
HCT: 26.6 % — ABNORMAL LOW (ref 36.0–46.0)
Hemoglobin: 8.6 g/dL — ABNORMAL LOW (ref 12.0–15.0)
MCH: 30.5 pg (ref 26.0–34.0)
MCHC: 32.3 g/dL (ref 30.0–36.0)
MCV: 94.3 fL (ref 80.0–100.0)
Platelets: 405 10*3/uL — ABNORMAL HIGH (ref 150–400)
RBC: 2.82 MIL/uL — ABNORMAL LOW (ref 3.87–5.11)
RDW: 16.2 % — ABNORMAL HIGH (ref 11.5–15.5)
WBC: 9.5 10*3/uL (ref 4.0–10.5)
nRBC: 0 % (ref 0.0–0.2)

## 2020-09-16 MED ORDER — HEPARIN SODIUM (PORCINE) 1000 UNIT/ML IJ SOLN
INTRAMUSCULAR | Status: AC | PRN
  Administered 2020-09-16: 8000 [IU] via INTRAVENOUS
  Administered 2020-09-16: 3000 [IU] via INTRAVENOUS

## 2020-09-16 MED ORDER — IODIXANOL 320 MG/ML IV SOLN
100.0000 mL | Freq: Once | INTRAVENOUS | Status: AC | PRN
  Administered 2020-09-16: 50 mL via INTRA_ARTERIAL

## 2020-09-16 MED ORDER — IOHEXOL 240 MG/ML SOLN
INTRAMUSCULAR | Status: AC
  Filled 2020-09-16: qty 100

## 2020-09-16 MED ORDER — FENTANYL CITRATE (PF) 100 MCG/2ML IJ SOLN
INTRAMUSCULAR | Status: AC
  Filled 2020-09-16: qty 2

## 2020-09-16 MED ORDER — HEPARIN SODIUM (PORCINE) 1000 UNIT/ML IJ SOLN
INTRAMUSCULAR | Status: AC
  Filled 2020-09-16: qty 1

## 2020-09-16 MED ORDER — ASPIRIN 325 MG PO TABS
ORAL_TABLET | ORAL | Status: AC | PRN
  Administered 2020-09-16: 650 mg via ORAL

## 2020-09-16 MED ORDER — ASPIRIN EC 325 MG PO TBEC
DELAYED_RELEASE_TABLET | ORAL | Status: AC
  Filled 2020-09-16: qty 2

## 2020-09-16 MED ORDER — FENTANYL CITRATE (PF) 100 MCG/2ML IJ SOLN
INTRAMUSCULAR | Status: AC | PRN
  Administered 2020-09-16 (×2): 25 ug via INTRAVENOUS

## 2020-09-16 MED ORDER — MIDAZOLAM HCL 2 MG/2ML IJ SOLN
INTRAMUSCULAR | Status: AC | PRN
  Administered 2020-09-16: 0.5 mg via INTRAVENOUS

## 2020-09-16 MED ORDER — MIDAZOLAM HCL 2 MG/2ML IJ SOLN
INTRAMUSCULAR | Status: AC
  Filled 2020-09-16: qty 2

## 2020-09-16 MED ORDER — CEFAZOLIN SODIUM-DEXTROSE 2-4 GM/100ML-% IV SOLN
INTRAVENOUS | Status: AC
  Filled 2020-09-16: qty 100

## 2020-09-16 MED ORDER — LIDOCAINE HCL 1 % IJ SOLN
INTRAMUSCULAR | Status: AC
  Filled 2020-09-16: qty 20

## 2020-09-16 MED ORDER — CEFAZOLIN (ANCEF) 1 G IV SOLR
INTRAVENOUS | Status: AC | PRN
  Administered 2020-09-16: 2 g

## 2020-09-16 MED ORDER — SODIUM CHLORIDE 0.9 % IV SOLN: INTRAVENOUS | Status: AC

## 2020-09-16 MED ORDER — IODIXANOL 320 MG/ML IV SOLN
100.0000 mL | Freq: Once | INTRAVENOUS | Status: AC | PRN
  Administered 2020-09-16: 65 mL via INTRA_ARTERIAL

## 2020-09-16 MED ORDER — SODIUM CHLORIDE 0.9 % IV SOLN: INTRAVENOUS | Status: DC

## 2020-09-16 NOTE — H&P (Addendum)
Referring Physician(s): Penner,P  Supervising Physician: Corrie Mckusick  Patient Status:  [facility] OP   Chief Complaint:  Right foot pain/wound/gangrene of toes  Subjective: Pt familiar to IR service from consultation with Dr. Serafina Royals at [facility] on 09/01/20 to discuss treatment options for RLE ischemic changes. She is a 87 year old female with past medical history significant for atrial fibrillation, COPD, HLD and hypertension who was recently admitted to [facility] for weakness, anemia and lower GI bleed complicated by pneumonia.  She has since been discharged.  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  She denies left lower extremity pain.  She was evaluated by Dr. Radene Knee with vascular surgery at [facility] and underwent right lower extremity angiogram with right common iliac artery stent placement on 07/18/2020.  Prior to her admission at [facility] she was taking aspirin and Plavix which was held due to prior GI bleed.  She has intolerance to Eliquis and Xarelto saying that they make her feel sick.  Following discussions with Dr. Serafina Royals she now presents for additional right lower extremity arteriogram with possible endovascular intervention to hopefully improve inflow to right foot.  She currently denies fever, headache, chest pain, worsening dyspnea, cough, abdominal pain, back pain, nausea, vomiting or any bleeding since her discharge from out of hospital.  She is currently on no blood thinners at this time.      Allergies: Eliquis [apixaban] and Plavix [clopidogrel]  Medications: Prior to Admission medications   Medication Sig Start Date End Date Taking? Authorizing Provider  acetaminophen (TYLENOL) 500 MG tablet Take 500 mg by mouth daily.   Yes [provider]  amLODipine (NORVASC) 2.5 MG tablet Take 2.5 mg by mouth daily.    Yes [provider]  atorvastatin (LIPITOR) 40 MG tablet Take 40 mg by mouth daily.   Yes [provider]  buprenorphine (BUTRANS) 10 MCG/HR [place] 1 patch onto the skin every Friday.   Yes [provider]  Carboxymethylcellul-Glycerin (LUBRICATING EYE DROPS OP) Place 1 drop into both eyes daily.   Yes [provider]  ferrous sulfate 325 (65 FE) MG EC tablet Take 325 mg by mouth 3 (three) times daily.   Yes [provider]  furosemide (LASIX) 40 MG tablet Take 80 mg by mouth daily.   Yes [provider]  hydrochlorothiazide (HYDRODIURIL) 25 MG tablet Take 25 mg by mouth daily.   Yes [provider]  lisinopril (ZESTRIL) 40 MG tablet Take 40 mg by mouth daily.   Yes [provider]  Naphazoline-Glycerin (REDNESS RELIEF OP) Place 1 drop into both eyes daily as needed (redness).   Yes [provider]  oxyCODONE (OXY IR/ROXICODONE) 5 MG immediate release tablet Take 5-10 mg by mouth every 4 (four) hours as needed for severe pain.   Yes [provider]  potassium chloride SA (KLOR-CON) 20 MEQ tablet Take 20 mEq by mouth daily.   Yes [provider]  sodium chloride (OCEAN) 0.65 % SOLN nasal spray Place 1 spray into both nostrils as needed for congestion.   Yes [provider]     Vital Signs: BP (!) 126/59   Pulse (!) 113   Temp 98.5 F (36.9 C) (Oral)   Ht 5' 6.75" (1.695 m)   Wt 139 lb (63 kg)   SpO2 98%   BMI 21.93 kg/m  Physical Exam awake, alert.  Chest clear to auscultation bilaterally.  Heart with irregularly irregular rhythm.  Abdomen soft, positive bowel sounds, nontender; 3+ edema bilateral lower extremities, erythema of lower right leg and foot with dry gangrene noted on right first through fourth toes in addition to focus of gangrene along the medial aspect of the first MP joint.  Skin breakdown about the dorsal aspect of the right heel.   Imaging: No results  found.  Labs:  CBC: No results for input(s): WBC, HGB, HCT, PLT in the last 8760 hours.  COAGS: No results for input(s): INR, APTT in the last 8760 hours.  BMP: No results for input(s): NA, K, CL, CO2, GLUCOSE, BUN, CALCIUM, CREATININE, GFRNONAA, GFRAA in the last 8760 hours.  Invalid input(s): CMP  LIVER FUNCTION TESTS: No results for input(s): BILITOT, AST, ALT, ALKPHOS, PROT, ALBUMIN in the last 8760 hours.  Assessment and Plan: 87 year old female with past medical history significant for atrial fibrillation,COPD, HLD and hypertension who was recently admitted to [facility] for weakness, anemia and lower GI bleed complicated by pneumonia.  She has since been discharged.  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  She denies left lower extremity pain.  She was evaluated by Dr. Radene Knee with vascular surgery at [facility] and underwent right lower extremity angiogram with right common iliac artery stent placement on 07/18/2020.  Prior to her admission at [facility] she was taking aspirin and Plavix which was held due to prior GI bleed.  She has intolerance to Eliquis and Xarelto saying that they make her feel sick.  Following discussions with Dr. Serafina Royals she now presents for additional right lower extremity arteriogram with possible endovascular intervention to hopefully improve inflow to right foot.  Risks and benefits of procedure were discussed with the patient including, but not limited to bleeding, infection, vascular injury or contrast induced renal failure.  This interventional procedure involves the use of X-rays and because of the nature of the planned procedure, it is possible that we will have prolonged use of X-ray fluoroscopy.  Potential radiation risks to you include (but are not limited to) the following: - A slightly elevated risk for  cancer  several years later in life. This risk is typically less than 0.5% percent. This risk is low in comparison to the normal incidence of human cancer, which is 33% for women and 50% for men according to the [employer]. - Radiation induced injury can include skin redness, resembling a rash, tissue breakdown / ulcers and hair loss (which can be temporary or permanent).   The likelihood of either of these occurring depends on the difficulty of the procedure and whether you are sensitive to radiation due to previous procedures, disease, or genetic conditions.   IF your procedure requires a prolonged use of radiation, you will be notified and given written instructions for further action.  It is your responsibility to monitor the irradiated area for the 2 weeks following the procedure and to notify your physician if you are concerned that you have suffered a radiation induced injury.    All of the patient's questions were answered, patient is agreeable to proceed.  Consent signed and in chart.      Electronically Signed: D. Rowe Robert, PA-C 09/16/2020, 8:09 AM   I spent a total of 40 minutes at the the patient's bedside  AND on the patient's hospital floor or unit, greater than 50% of which was counseling/coordinating care for right lower extremity arteriogram with possible endovascular intervention

## 2020-09-16 NOTE — Discharge Instructions (Addendum)
Angiogram, Care After This sheet gives you information about how to care for yourself after your procedure. Your doctor may also give you more specific instructions. If you have problems or questions, contact your doctor. What can I expect after the procedure? After the procedure, it is common to have these problems at the point where the catheter was inserted:  Bruising.  Tenderness.  A collection of blood (hematoma). This may feel like a small lump under the skin at the insertion site. Follow these instructions at home: Insertion site care  Follow instructions from your doctor about how to take care of the area where the catheter was inserted. Make sure you: ? Wash your hands with soap and water before you change your bandage (dressing). If you cannot use soap and water, use hand sanitizer. ? Change your bandage as told by your doctor.  Do not take baths, swim, or use a hot tub until your doctor says it is okay.  You may shower 24-48 hours after the procedure, or as told by your doctor. To clean the area: ? Gently wash the area with plain soap and water. ? Pat the area dry with a clean towel. ? Do not rub the area. This may cause bleeding.  Check your insertion area every day for signs of infection. Check for: ? Redness, swelling, or pain. ? Fluid or blood. ? Warmth. ? Pus or a bad smell.  Do not apply powder or lotion to the area. Keep the area clean and dry.   Activity  Do not drive for 24 hours if you were given a medicine to help you relax (sedative).  Rest as told by your doctor, usually for 1-2 days.  Do not lift anything that is heavier than 10 lbs. (4.5 kg) or as told by your doctor.  If your insertion site was in your leg, try to avoid stairs for a few days.  Return to your normal activities as told by your doctor. Ask your doctor what activities are safe for you. General instructions  If the insertion area starts to bleed, lie flat and put pressure on the area.  If the bleeding does not stop, get help right away. This is an emergency.  Take over-the-counter and prescription medicines only as told by your doctor.  Drink enough fluid to keep your pee (urine) pale yellow.  Keep all follow-up visits as told by your doctor. This is important.   Contact a doctor if:  You have a fever.  You have chills.  You have redness, swelling, or pain around your insertion area.  You have fluid or blood coming from your insertion area.  The insertion area feels warm to the touch.  You have pus or a bad smell coming from your insertion area.  You have more bruising around the insertion area. Get help right away if:  You have a lot of pain in the insertion area.  The insertion area swells very fast.  The insertion area is bleeding, and the bleeding does not stop after you hold steady pressure on the area.  The area around the insertion area becomes pale, cool, tingly, or numb.  You have chest pain.  You have trouble breathing.  You have a rash.  You have any signs of a stroke. "BE FAST" is an easy way to remember the main warning signs: ? B - Balance. Signs are dizziness, sudden trouble walking, or loss of balance. ? E - Eyes. Signs are trouble seeing or a change  in how you see. ? F - Face. Signs are sudden weakness or loss of feeling of the face, or the face or eyelid drooping on one side. ? A - Arms. Signs are weakness or loss of feeling in an arm. This happens suddenly and usually on one side of the body. ? S - Speech. Signs are sudden trouble speaking, slurred speech, or trouble understanding what people say. ? T - Time. Time to call emergency services. Write down what time symptoms started.  You have other signs of a stroke, such as: ? A sudden, very bad headache with no known cause. ? Feeling like you may vomit (nausea). ? Vomiting. ? A seizure. These symptoms may be an emergency. Do not wait to see if the symptoms will go away. Get  medical help right away. Call your local emergency services (911 in the [place] Do not drive yourself to the hospital. Summary  After the procedure, it is common to have bruising and tenderness at the insertion area.  Do not take baths, swim, or use a hot tub until your doctor says it is okay to do so. You may shower 24-48 hours after the procedure or as told by your doctor.  It is important to rest and drink plenty of fluids.  If the insertion area starts to bleed, lie flat and put pressure on the area. If the bleeding does not stop, get help right away. This is an emergency. This information is not intended to replace advice given to you by your health care provider. Make sure you discuss any questions you have with your health care provider. Document Revised: 06/10/2019 Document Reviewed: 06/10/2019 Elsevier Patient Education  2021 Else[employer] Inc. Moderate Conscious Sedation, Adult Sedation is the use of medicines to promote relaxation and to relieve discomfort and anxiety. Moderate conscious sedation is a type of sedation. Under moderate conscious sedation, you are less alert than normal, but you are still able to respond to instructions, touch, or both. Moderate conscious sedation is used during short medical and dental procedures. It is milder than deep sedation, which is a type of sedation under which you cannot be easily woken up. It is also milder than general anesthesia, which is the use of medicines to make you unconscious. Moderate conscious sedation allows you to return to your regular activities sooner. Tell a health care provider about:  Any allergies you have.  All medicines you are taking, including vitamins, herbs, eye drops, creams, and over-the-counter medicines.  Any use of steroids. This includes steroids taken by mouth or as a cream.  Any problems you or family members have had with sedatives and anesthetic medicines.  Any blood disorders you have.  Any surgeries you  have had.  Any medical conditions you have, such as sleep apnea.  Whether you are pregnant or may be pregnant.  Any use of cigarettes, alcohol, marijuana, or drugs. What are the risks? Generally, this is a safe procedure. However, problems may occur, including:  Getting too much medicine (oversedation).  Nausea.  Allergic reaction to medicines.  Trouble breathing. If this happens, a breathing tube may be used. It will be removed when you are awake and breathing on your own.  Heart trouble.  Lung trouble.  Confusion that gets better with time (emergence delirium). What happens before the procedure? Staying hydrated Follow instructions from your health care provider about hydration, which may include:  Up to 2 hours before the procedure - you may continue to drink clear liquids, such   as water, clear fruit juice, black coffee, and plain tea. Eating and drinking restrictions Follow instructions from your health care provider about eating and drinking, which may include:  8 hours before the procedure - stop eating heavy meals or foods, such as meat, fried foods, or fatty foods.  6 hours before the procedure - stop eating light meals or foods, such as toast or cereal.  6 hours before the procedure - stop drinking milk or drinks that contain milk.  2 hours before the procedure - stop drinking clear liquids. Medicines Ask your health care provider about:  Changing or stopping your regular medicines. This is especially important if you are taking diabetes medicines or blood thinners.  Taking medicines such as aspirin and ibuprofen. These medicines can thin your blood. Do not take these medicines unless your health care provider tells you to take them.  Taking over-the-counter medicines, vitamins, herbs, and supplements. Tests and exams  You will have a physical exam.  You may have blood tests done to show how well: ? Your kidneys and liver work. ? Your blood clots. General  instructions  Plan to have a responsible adult take you home from the hospital or clinic.  If you will be going home right after the procedure, plan to have a responsible adult care for you for the time you are told. This is important. What happens during the procedure?  You will be given the sedative. The sedative may be given: ? As a pill that you will swallow. It can also be inserted into the rectum. ? As a spray through the nose. ? As an injection into the muscle. ? As an injection into the vein through an IV.  You may be given oxygen as needed.  Your breathing, heart rate, and blood pressure will be monitored during the procedure.  The medical or dental procedure will be done. The procedure may vary among health care providers and hospitals.   What happens after the procedure?  Your blood pressure, heart rate, breathing rate, and blood oxygen level will be monitored until you leave the hospital or clinic.  You will get fluids through your IV if needed.  Do not drive or operate machinery until your health care provider says that it is safe. Summary  Sedation is the use of medicines to promote relaxation and to relieve discomfort and anxiety. Moderate conscious sedation is a type of sedation that is used during short medical and dental procedures.  Tell the health care provider about any medical conditions that you have and about all the medicines that you are taking.  You will be given the sedative as a pill, a spray through the nose, an injection into the muscle, or an injection into the vein through an IV. Vital signs are monitored during the sedation.  Moderate conscious sedation allows you to return to your regular activities sooner. This information is not intended to replace advice given to you by your health care provider. Make sure you discuss any questions you have with your health care provider. Document Revised: 12/04/2019 Document Reviewed: 07/02/2019 Elsevier  Patient Education  2021 Elsevier Inc.  

## 2020-09-16 NOTE — Procedures (Signed)
Interventional Radiology Procedure Note  Procedure:    US guided left CFA access. US guided right DP access  Balloon angioplasty and stenting of CTO of right SFA/popliteal artery, Balloon angioplasty of right AT with PTA.   Restoration of flow to the right foot via patent fem-pop and AT to the ankle.  Small vessel disease of the forefoot.  Complications: None  Recommendations:  - Start full dose aspirin daily - 4 hour recovery  - plan for DC in 4 hours when goals met - Do not submerge for 7 days - Routine care - expect right foot post-reperfusion syndrome, with swelling, pain, should improve in about 5-7 days - follow up with wound care on schedule - follow up with Dr. Earleen Newport in clinic in 4-6 weeks.  [phone]   Signed,  Dulcy Fanny Earleen Newport, DO

## 2020-09-16 NOTE — Sedation Documentation (Signed)
Handoff with short stay, left groin site level 0 with dressing clean dry and intact, right pedal site dressing is clean dry and intact. Pulses dopplerable.  6 fr angioseal deployed at 1244.

## 2020-09-17 ENCOUNTER — Emergency Department (HOSPITAL_COMMUNITY): Payer: Medicare HMO

## 2020-09-17 ENCOUNTER — Emergency Department (HOSPITAL_COMMUNITY)
Admission: EM | Admit: 2020-09-17 | Discharge: 2020-09-20 | Disposition: E | Payer: Medicare HMO | Attending: Emergency Medicine | Admitting: Emergency Medicine

## 2020-09-17 ENCOUNTER — Encounter (HOSPITAL_COMMUNITY): Payer: Self-pay | Admitting: Emergency Medicine

## 2020-09-17 DIAGNOSIS — Z20822 Contact with and (suspected) exposure to covid-19: Secondary | ICD-10-CM | POA: Insufficient documentation

## 2020-09-17 DIAGNOSIS — I10 Essential (primary) hypertension: Secondary | ICD-10-CM | POA: Insufficient documentation

## 2020-09-17 DIAGNOSIS — I469 Cardiac arrest, cause unspecified: Secondary | ICD-10-CM | POA: Insufficient documentation

## 2020-09-17 DIAGNOSIS — I517 Cardiomegaly: Secondary | ICD-10-CM | POA: Diagnosis not present

## 2020-09-17 DIAGNOSIS — K7290 Hepatic failure, unspecified without coma: Secondary | ICD-10-CM | POA: Diagnosis not present

## 2020-09-17 DIAGNOSIS — R0602 Shortness of breath: Secondary | ICD-10-CM | POA: Diagnosis present

## 2020-09-17 DIAGNOSIS — R0689 Other abnormalities of breathing: Secondary | ICD-10-CM | POA: Diagnosis not present

## 2020-09-17 DIAGNOSIS — Z79899 Other long term (current) drug therapy: Secondary | ICD-10-CM | POA: Insufficient documentation

## 2020-09-17 DIAGNOSIS — A419 Sepsis, unspecified organism: Secondary | ICD-10-CM | POA: Insufficient documentation

## 2020-09-17 DIAGNOSIS — I4891 Unspecified atrial fibrillation: Secondary | ICD-10-CM | POA: Diagnosis not present

## 2020-09-17 DIAGNOSIS — R6521 Severe sepsis with septic shock: Secondary | ICD-10-CM | POA: Insufficient documentation

## 2020-09-17 DIAGNOSIS — R4781 Slurred speech: Secondary | ICD-10-CM | POA: Diagnosis not present

## 2020-09-17 DIAGNOSIS — J9600 Acute respiratory failure, unspecified whether with hypoxia or hypercapnia: Secondary | ICD-10-CM | POA: Diagnosis not present

## 2020-09-17 DIAGNOSIS — R404 Transient alteration of awareness: Secondary | ICD-10-CM | POA: Diagnosis not present

## 2020-09-17 DIAGNOSIS — I959 Hypotension, unspecified: Secondary | ICD-10-CM | POA: Diagnosis not present

## 2020-09-17 DIAGNOSIS — R0902 Hypoxemia: Secondary | ICD-10-CM | POA: Diagnosis not present

## 2020-09-17 LAB — I-STAT VENOUS BLOOD GAS, ED
Acid-base deficit: 24 mmol/L — ABNORMAL HIGH (ref 0.0–2.0)
Bicarbonate: 5.4 mmol/L — ABNORMAL LOW (ref 20.0–28.0)
Calcium, Ion: 0.99 mmol/L — ABNORMAL LOW (ref 1.15–1.40)
HCT: 25 % — ABNORMAL LOW (ref 36.0–46.0)
Hemoglobin: 8.5 g/dL — ABNORMAL LOW (ref 12.0–15.0)
O2 Saturation: 86 %
Potassium: 5.7 mmol/L — ABNORMAL HIGH (ref 3.5–5.1)
Sodium: 132 mmol/L — ABNORMAL LOW (ref 135–145)
TCO2: 6 mmol/L — ABNORMAL LOW (ref 22–32)
pCO2, Ven: 20.5 mmHg — ABNORMAL LOW (ref 44.0–60.0)
pH, Ven: 7.03 — CL (ref 7.250–7.430)
pO2, Ven: 72 mmHg — ABNORMAL HIGH (ref 32.0–45.0)

## 2020-09-17 LAB — LACTIC ACID, PLASMA: Lactic Acid, Venous: >11 mmol/L (ref 0.5–1.9)

## 2020-09-17 LAB — COMPREHENSIVE METABOLIC PANEL
ALT: 1472 U/L — ABNORMAL HIGH (ref 0–44)
AST: 3603 U/L — ABNORMAL HIGH (ref 15–41)
Albumin: 2.3 g/dL — ABNORMAL LOW (ref 3.5–5.0)
Alkaline Phosphatase: 77 U/L (ref 38–126)
BUN: 19 mg/dL (ref 8–23)
CO2: <7 mmol/L — ABNORMAL LOW (ref 22–32)
Calcium: 8.8 mg/dL — ABNORMAL LOW (ref 8.9–10.3)
Chloride: 97 mmol/L — ABNORMAL LOW (ref 98–111)
Creatinine, Ser: 2.72 mg/dL — ABNORMAL HIGH (ref 0.44–1.00)
GFR, Estimated: 16 mL/min — ABNORMAL LOW (ref >60)
Glucose, Bld: 33 mg/dL — CL (ref 70–99)
Potassium: 5.8 mmol/L — ABNORMAL HIGH (ref 3.5–5.1)
Sodium: 134 mmol/L — ABNORMAL LOW (ref 135–145)
Total Bilirubin: 1.2 mg/dL (ref 0.3–1.2)
Total Protein: 5 g/dL — ABNORMAL LOW (ref 6.5–8.1)

## 2020-09-17 LAB — I-STAT CHEM 8, ED
BUN: 20 mg/dL (ref 8–23)
Calcium, Ion: 1 mmol/L — ABNORMAL LOW (ref 1.15–1.40)
Chloride: 102 mmol/L (ref 98–111)
Creatinine, Ser: 2.3 mg/dL — ABNORMAL HIGH (ref 0.44–1.00)
Glucose, Bld: 27 mg/dL — CL (ref 70–99)
HCT: 27 % — ABNORMAL LOW (ref 36.0–46.0)
Hemoglobin: 9.2 g/dL — ABNORMAL LOW (ref 12.0–15.0)
Potassium: 5.7 mmol/L — ABNORMAL HIGH (ref 3.5–5.1)
Sodium: 131 mmol/L — ABNORMAL LOW (ref 135–145)
TCO2: 9 mmol/L — ABNORMAL LOW (ref 22–32)

## 2020-09-17 LAB — SARS CORONAVIRUS 2 BY RT PCR (HOSPITAL ORDER, PERFORMED IN ~~LOC~~ HOSPITAL LAB): SARS Coronavirus 2: NEGATIVE

## 2020-09-17 MED ORDER — SODIUM CHLORIDE 0.9 % IV SOLN
2.0000 g | Freq: Once | INTRAVENOUS | Status: AC
  Administered 2020-09-17: 2 g via INTRAVENOUS
  Filled 2020-09-17: qty 2

## 2020-09-17 MED ORDER — SODIUM BICARBONATE 8.4 % IV SOLN
50.0000 meq | Freq: Once | INTRAVENOUS | Status: AC
  Administered 2020-09-17: 50 meq via INTRAVENOUS
  Filled 2020-09-17: qty 50

## 2020-09-17 MED ORDER — METRONIDAZOLE IN NACL 5-0.79 MG/ML-% IV SOLN
500.0000 mg | Freq: Once | INTRAVENOUS | Status: AC
  Administered 2020-09-17: 500 mg via INTRAVENOUS
  Filled 2020-09-17: qty 100

## 2020-09-17 MED ORDER — LACTATED RINGERS IV BOLUS (SEPSIS)
1000.0000 mL | Freq: Once | INTRAVENOUS | Status: AC
  Administered 2020-09-17: 1000 mL via INTRAVENOUS

## 2020-09-17 MED ORDER — DEXTROSE 50 % IV SOLN
INTRAVENOUS | Status: AC
  Filled 2020-09-17: qty 50

## 2020-09-17 MED ORDER — VANCOMYCIN VARIABLE DOSE PER UNSTABLE RENAL FUNCTION (PHARMACIST DOSING): Status: DC

## 2020-09-17 MED ORDER — VANCOMYCIN HCL IN DEXTROSE 1-5 GM/200ML-% IV SOLN: 1000.0000 mg | Freq: Once | INTRAVENOUS | Status: DC

## 2020-09-17 MED ORDER — LACTATED RINGERS IV SOLN: INTRAVENOUS | Status: DC

## 2020-09-17 MED ORDER — SODIUM CHLORIDE 0.9 % IV SOLN: 2.0000 g | INTRAVENOUS | Status: DC

## 2020-09-18 LAB — CBC WITH DIFFERENTIAL/PLATELET
Abs Immature Granulocytes: 0.6 10*3/uL — ABNORMAL HIGH (ref 0.00–0.07)
Basophils Absolute: 0 10*3/uL (ref 0.0–0.1)
Basophils Relative: 0 %
Eosinophils Absolute: 0 10*3/uL (ref 0.0–0.5)
Eosinophils Relative: 0 %
HCT: 29 % — ABNORMAL LOW (ref 36.0–46.0)
Hemoglobin: 7.6 g/dL — ABNORMAL LOW (ref 12.0–15.0)
Immature Granulocytes: 3 %
Lymphocytes Relative: 9 %
Lymphs Abs: 2.1 10*3/uL (ref 0.7–4.0)
MCH: 29.6 pg (ref 26.0–34.0)
MCHC: 26.2 g/dL — ABNORMAL LOW (ref 30.0–36.0)
MCV: 112.8 fL — ABNORMAL HIGH (ref 80.0–100.0)
Monocytes Absolute: 1.6 10*3/uL — ABNORMAL HIGH (ref 0.1–1.0)
Monocytes Relative: 6 %
Neutro Abs: 20.1 10*3/uL — ABNORMAL HIGH (ref 1.7–7.7)
Neutrophils Relative %: 82 %
Platelets: 358 10*3/uL (ref 150–400)
RBC: 2.57 MIL/uL — ABNORMAL LOW (ref 3.87–5.11)
RDW: 16.8 % — ABNORMAL HIGH (ref 11.5–15.5)
WBC: 24.4 10*3/uL — ABNORMAL HIGH (ref 4.0–10.5)
nRBC: 0 % (ref 0.0–0.2)

## 2020-09-20 NOTE — ED Notes (Signed)
Pt to morgue at this time.

## 2020-09-20 NOTE — Progress Notes (Signed)
Pharmacy Antibiotic Note  Janet Rogers is a 87 y.o. female admitted on 09/30/2020 with sepsis.  Pharmacy has been consulted for Cefepime and Vancomycin dosing.      Temp (24hrs), Avg:94.7 F (34.8 C), Min:94.7 F (34.8 C), Max:94.7 F (34.8 C)  Recent Labs  Lab 09/16/20 0807 2020-09-30 0950  WBC 9.5  --   CREATININE 1.20* 2.30*    Estimated Creatinine Clearance: 14.7 mL/min (A) (by C-G formula based on SCr of 2.3 mg/dL (H)).    Allergies  Allergen Reactions  . Eliquis [Apixaban] Nausea Only    Severe fatigue   . Plavix [Clopidogrel] Nausea Only    Severe fatigue     Antimicrobials this admission: 1/29 Cefepime >>  1/29 Vancomycin >>   Dose adjustments this admission: N/a  Microbiology results: Pending   Plan:  - Cefepime 2g IV q24h - Vancomycin 1000mg  IV x 1 dose  - Will dose Vancomycin going forward based on random levels due to patient AKI Scr 1/28 was 1.2 on 1/29 2.3 - Monitor patients renal function and urine output  - De-escalate ABX when appropriate   Thank you for allowing pharmacy to be a part of this patient's care.  Duanne Limerick PharmD. BCPS September 30, 2020 10:25 AM

## 2020-09-20 NOTE — ED Provider Notes (Signed)
[facility] EMERGENCY DEPARTMENT Provider Note   CSN: [id number] Arrival date & time: Oct 01, 2020  [id number]     History Chief Complaint  Patient presents with  . Code Sepsis    Janet Rogers is a 87 y.o. female.  Level 5 caveat secondary to acuity of condition.  87 year old female with history of peripheral vascular disease status post angioplasty and stenting of right SFA popliteal for gangrenous right lower extremity yesterday by IR.  Returned home last evening and has been less responsive.  Patient denies headache chest pain abdominal pain.  Positive shortness of breath.  The history is provided by the patient and the EMS personnel.  Shortness of Breath Severity:  Moderate Onset quality:  Gradual Timing:  Constant Progression:  Unchanged Chronicity:  New Relieved by:  Nothing Worsened by:  Nothing Ineffective treatments:  None tried Associated symptoms: no abdominal pain, no chest pain, no cough, no fever, no headaches and no vomiting   Risk factors: recent surgery        History reviewed. No pertinent past medical history. HTN, afib, pvd There are no problems to display for this patient.   Past Surgical History:  Procedure Laterality Date  . IR ANGIOGRAM EXTREMITY RIGHT  09/16/2020  . IR ANGIOGRAM PELVIS SELECTIVE OR SUPRASELECTIVE  09/16/2020  . IR FEM POP ART PTA MOD SED  09/16/2020  . IR FEM POP ART STENT INC PTA MOD SED  09/16/2020  . IR US GUIDE VASC ACCESS LEFT  09/16/2020  . IR US GUIDE VASC ACCESS RIGHT  09/16/2020     OB History   No obstetric history on file.     History reviewed. No pertinent family history.     Home Medications Prior to Admission medications   Medication Sig Start Date End Date Taking? Authorizing Provider  acetaminophen (TYLENOL) 500 MG tablet Take 500 mg by mouth daily.    [provider]  amLODipine (NORVASC) 2.5 MG tablet Take 2.5 mg by mouth daily.    [provider]  atorvastatin (LIPITOR) 40  MG tablet Take 40 mg by mouth daily.    [provider]  buprenorphine Haze Rushing) 10 MCG/HR Gould 1 patch onto the skin every Friday.    [provider]  Carboxymethylcellul-Glycerin (LUBRICATING EYE DROPS OP) Place 1 drop into both eyes daily.    [provider]  ferrous sulfate 325 (65 FE) MG EC tablet Take 325 mg by mouth 3 (three) times daily.    [provider]  furosemide (LASIX) 40 MG tablet Take 80 mg by mouth daily.    [provider]  hydrochlorothiazide (HYDRODIURIL) 25 MG tablet Take 25 mg by mouth daily.    [provider]  lisinopril (ZESTRIL) 40 MG tablet Take 40 mg by mouth daily.    [provider]  Naphazoline-Glycerin (REDNESS RELIEF OP) Place 1 drop into both eyes daily as needed (redness).    [provider]  oxyCODONE (OXY IR/ROXICODONE) 5 MG immediate release tablet Take 5-10 mg by mouth every 4 (four) hours as needed for severe pain.    [provider]  potassium chloride SA (KLOR-CON) 20 MEQ tablet Take 20 mEq by mouth daily.    [provider]  sodium chloride (OCEAN) 0.65 % SOLN nasal spray Place 1 spray into both nostrils as needed for congestion.    [provider]    Allergies    Eliquis [apixaban] and Plavix [clopidogrel]  Review of Systems   Review of Systems  Unable to perform ROS: Mental status change  Constitutional: Negative for fever.  Respiratory: Positive for shortness of breath. Negative for cough.   Cardiovascular: Negative for chest pain.  Gastrointestinal: Negative for abdominal pain and vomiting.  Neurological: Negative for headaches.    Physical Exam Updated Vital Signs BP (!) 63/53   Pulse 75   Temp (!) 94.7 F (34.8 C) (Rectal)   Resp (!) 0   Ht 5\' 6"  (1.676 m)   Wt 63 kg   SpO2 92%   BMI 22.42 kg/m   Physical Exam Vitals and nursing note reviewed.  Constitutional:      General: She is in acute distress.     Appearance: She is  well-developed and well-nourished. She is ill-appearing.  HENT:     Head: Normocephalic and atraumatic.  Eyes:     Conjunctiva/sclera: Conjunctivae normal.  Cardiovascular:     Rate and Rhythm: Normal rate. Rhythm irregular.     Heart sounds: No murmur heard.   Pulmonary:     Effort: Tachypnea and accessory muscle usage present. No respiratory distress.     Breath sounds: Examination of the right-upper field reveals rhonchi. Examination of the right-middle field reveals rhonchi. Rhonchi present.  Abdominal:     Palpations: Abdomen is soft.     Tenderness: There is no abdominal tenderness. There is no guarding or rebound.  Musculoskeletal:        General: No swelling or edema.     Cervical back: Neck supple.     Comments: She has dry gangrene of right lower extremity.  Both lower extremities are cool.  Pulses not palpable.  Left lower extremity has a wound on the lateral aspect and some weeping discharge although not grossly infected.  Skin:    General: Skin is warm and dry.  Neurological:     General: No focal deficit present.     Mental Status: She is easily aroused.     Comments: Initially could tell me her name and it was Saturday.  She is moving all extremities.  Not following commands.  Psychiatric:        Mood and Affect: Mood and affect normal.     ED Results / Procedures / Treatments   Labs (all labs ordered are listed, but only abnormal results are displayed) Labs Reviewed  LACTIC ACID, PLASMA - Abnormal; Notable for the following components:      Result Value   Lactic Acid, Venous >11.0 (*)    All other components within normal limits  COMPREHENSIVE METABOLIC PANEL - Abnormal; Notable for the following components:   Sodium 134 (*)    Potassium 5.8 (*)    Chloride 97 (*)    CO2 <7 (*)    Glucose, Bld 33 (*)    Creatinine, Ser 2.72 (*)    Calcium 8.8 (*)    Total Protein 5.0 (*)    Albumin 2.3 (*)    AST 3,603 (*)    ALT 1,472 (*)    GFR, Estimated 16 (*)     All other components within normal limits  CBC WITH DIFFERENTIAL/PLATELET - Abnormal; Notable for the following components:   WBC 24.4 (*)    RBC 2.57 (*)    Hemoglobin 7.6 (*)    HCT 29.0 (*)    MCV 112.8 (*)    MCHC 26.2 (*)    RDW 16.8 (*)    Neutro Abs 20.1 (*)    Monocytes Absolute 1.6 (*)    Abs Immature Granulocytes 0.60 (*)  All other components within normal limits  I-STAT CHEM 8, ED - Abnormal; Notable for the following components:   Sodium 131 (*)    Potassium 5.7 (*)    Creatinine, Ser 2.30 (*)    Glucose, Bld 27 (*)    Calcium, Ion 1.00 (*)    TCO2 9 (*)    Hemoglobin 9.2 (*)    HCT 27.0 (*)    All other components within normal limits  I-STAT VENOUS BLOOD GAS, ED - Abnormal; Notable for the following components:   pH, Ven 7.030 (*)    pCO2, Ven 20.5 (*)    pO2, Ven 72.0 (*)    Bicarbonate 5.4 (*)    TCO2 6 (*)    Acid-base deficit 24.0 (*)    Sodium 132 (*)    Potassium 5.7 (*)    Calcium, Ion 0.99 (*)    HCT 25.0 (*)    Hemoglobin 8.5 (*)    All other components within normal limits  SARS CORONAVIRUS 2 BY RT PCR (HOSPITAL ORDER, [facility])  CULTURE, BLOOD (ROUTINE X 2)  CULTURE, BLOOD (ROUTINE X 2)  URINE CULTURE  LACTIC ACID, PLASMA  URINALYSIS, ROUTINE W REFLEX MICROSCOPIC  I-STAT VENOUS BLOOD GAS, ED    EKG EKG Interpretation  Date/Time:  10/13/20 09:23:15 EST Ventricular Rate:  85 PR Interval:    QRS Duration: 102 QT Interval:  403 QTC Calculation: 480 R Axis:   -63 Text Interpretation: Atrial fibrillation Anteroseptal infarct, age indeterminate No old tracing to compare Confirmed by Aletta Edouard [phone]) on 2020-10-13 9:31:56 AM   Radiology IR Angiogram Extremity Right  Result Date: 09/16/2020 INDICATION: 87 year old female presents with gangrene right forefoot, for attempt at revascularization femoropopliteal and tibial disease for palliation, given the imminent amputation EXAM:  ULTRASOUND-GUIDED ACCESS LEFT COMMON FEMORAL ARTERY ULTRASOUND GUIDED ACCESS RIGHT DORSALIS PEDIS ANGIOGRAM OF THE PELVIS AND RIGHT LOWER EXTREMITY BALLOON ANGIOPLASTY AND STENT OF THE FEMOROPOPLITEAL SEGMENT, TREATING LONG SEGMENT CHRONIC TOTAL OCCLUSION BALLOON ANGIOPLASTY OF OCCLUDED ANTERIOR TIBIAL ARTERY ANGIO-SEAL FOR HEMOSTASIS MEDICATIONS: 2 g Ancef.  The antibiotic was administered during procedure 11000 units IV heparin ANESTHESIA/SEDATION: Moderate (conscious) sedation was employed during this procedure. A total of Versed 0.5 mg and Fentanyl 50 mcg was administered intravenously. Moderate Sedation Time: 165 minutes. The patient's level of consciousness and vital signs were monitored continuously by radiology nursing throughout the procedure under my direct supervision. CONTRAST:  68mL VISIPAQUE IODIXANOL 320 MG/ML IV SOLN, 54mL VISIPAQUE IODIXANOL 320 MG/ML IV SOLN FLUOROSCOPY TIME:  Fluoroscopy Time: 39 minutes 30 seconds (189 mGy). COMPLICATIONS: None PROCEDURE: Informed consent was obtained from the patient following explanation of the procedure, risks, benefits and alternatives. The patient understands, agrees and consents for the procedure. All questions were addressed. A time out was performed prior to the initiation of the procedure. Maximal barrier sterile technique utilized including caps, mask, sterile gowns, sterile gloves, large sterile drape, hand hygiene, and Betadine prep. Ultrasound survey of the left inguinal region was performed with images stored and sent to PACs, confirming patency of the vessel. 1% lidocaine was used for local anesthesia. Small stab incision was made with 11 blade scalpel. Blunt dissection was performed. A micropuncture needle was used access the left common femoral artery under ultrasound. With excellent arterial blood flow returned, and an .018 micro wire was passed through the needle, observed enter the abdominal aorta under fluoroscopy. The needle was removed, and  a micropuncture sheath was placed over the wire.  The inner dilator and wire were removed, and an 035 glide wire was advanced under fluoroscopy into the abdominal aorta. The sheath was removed and a standard 5 [place] vascular sheath was placed. The dilator was removed and the sheath was flushed. Once the sheath was in place, red marrow injection was performed, confirming a narrowing of the iliac artery. Given that this would be impeding the progress of the sheath, potentially being occlusive, 6 mm balloon angioplasty was performed. Omni Flush catheter was then advanced to the abdominal aorta. Angiogram was performed of the pelvic arteries. Combination of the Omni Flush catheter and a C2 Cobra catheter used to navigate a Glidewire into the right external iliac artery. With the Glidewire position distally, the Cobra catheter was advanced into the common femoral artery. Glidewire was removed and a rose in wire was placed. Catheter was removed. Full heparinization was performed. Six French 45 cm terumo destination sheath was placed over the bifurcation into the external iliac artery. Angiogram of the right lower extremity was performed to the proximal tibial arteries. 90 cm Navicross catheter and a Glidewire were used to engage the stump of the native SFA. Never cross catheter advanced into the mid thigh, encountered calcifications within the mid thigh. Wire was removed and injection was performed demonstrating either subintimal or extravascular position of the catheter. The never cross catheter was not able to be navigated into a more confident channel in the native SFA. Therefore we elected to puncture the anterior tibial artery and work backwards. 1% lidocaine was used to anesthetize the dorsum of the right foot. Micropuncture was used to access the dorsalis pedis. 018 V18 wire was passed retrograde through the dorsalis pedis and distal anterior tibial artery. A wire position in the distal anterior tibial artery was  achieved and the needle was removed. An 018 crossing catheter was advanced bare back, with attempt at pushing the crossing catheter and wire retrograde. A combination of first and 018 command wire and then an 014 Glidewire were used to find the anterior tibial artery channel within the proximal third of the artery. Ultimately the quick cross catheter was impeded by calcifications in the tibial artery, without enough support to push the Glidewire retrograde. Exchange was then made again for the 014 command wire. The 014 command wire entered the proximal anterior tibial artery. We then upsized the crossing catheter to a 90 cm quick cross catheter. We were then successful with the 018 wire and the 035 catheter with advancing the catheter to the popliteal artery. The goal at this point was finding a similar channel between the catheter from above and the catheter from below. An 014 wire was passed retrograde through the length of the SFA. 2 mm balloon angioplasty was then performed along the entire length of the occluded anterior tibial artery, popliteal artery, and SFA. We were unable to run day view the wire from below with the never cross catheter from above, likely because of 2 subintimal planes. Rose in wire was passed through the antegrade crossing catheter with 5 mm balloon angioplasty performed in the adductor canal. We then again attempted to rendezvous the microwire from below with the crossing catheter from above. This was unsuccessful given 2 different planes. Ultimately we push the crossing catheter from the dorsalis pedis access retrograde into the common femoral artery and the external iliac artery. We were then successfully able to retrograde pass the command wire through the never cross catheter and externalized the wire at the left common femoral access.  At this point, 5 mm balloon angioplasty was performed along the entire length of the SFA, popliteal artery. We then initiated stenting with placement  of a 5 mm x 80 mm Supera stent. This was extended proximally with a 5 mm by 100 mm viabahn graft. We finally extended this with a 6 mm by 250 mm viabahn graft. 6 mm balloon angioplasty was performed as a post dilation along the entire length of the stent graft system as well as at the proximal superficial femoral artery. At this point, an 014150 cm crossing catheter was passed distally to the anterior tibial artery and the dorsalis pedis puncture site. The 014 command wire was removed and a 300 cm whisper wire was placed antegrade through the catheter. The catheter was removed. Balloon angioplasty was then performed along the length of the anterior tibial artery with a 3 mm by 220 mm coyote balloon. Balloon catheter was removed and a final angiogram was performed of the entire right lower extremity. Pulse was checked at the right ankle confirming restoration of the pulse. We then withdrew all catheters and wires, placed a Bentson wire into the left common femoral access site and deployed a 6 [place] Angio-Seal. Patient tolerated the procedure well and remained hemodynamically stable throughout. No complications were encountered and no significant blood loss. FINDINGS: Initial angiogram confirms a right SFA/popliteal artery occlusion, with no significant flow into the tibial arteries. 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 0% flow at the beginning of the case with 100% improved perfusion at the conclusion. 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 Angio-Seal deployed for hemostasis. Signed, Dulcy Fanny. Dellia Nims,  RPVI [facility] Radiology Electronically Signed   By: Corrie Mckusick D.O.   On: 09/16/2020 17:58   IR Angiogram Pelvis Selective Or Supraselective  Result Date: 09/16/2020 INDICATION: 87 year old female presents with gangrene right forefoot, for attempt at revascularization femoropopliteal and tibial disease for palliation, given the imminent amputation EXAM: ULTRASOUND-GUIDED ACCESS LEFT COMMON FEMORAL ARTERY ULTRASOUND GUIDED ACCESS RIGHT DORSALIS PEDIS ANGIOGRAM OF THE PELVIS AND RIGHT LOWER EXTREMITY BALLOON ANGIOPLASTY AND STENT OF THE FEMOROPOPLITEAL SEGMENT, TREATING LONG SEGMENT CHRONIC TOTAL OCCLUSION BALLOON ANGIOPLASTY OF OCCLUDED ANTERIOR TIBIAL ARTERY ANGIO-SEAL FOR HEMOSTASIS MEDICATIONS: 2 g Ancef.  The antibiotic was administered during procedure 11000 units IV heparin ANESTHESIA/SEDATION: Moderate (conscious) sedation was employed during this procedure. A total of Versed 0.5 mg and Fentanyl 50 mcg was administered intravenously. Moderate Sedation Time: 165 minutes. The patient's level of consciousness and vital signs were monitored continuously by radiology nursing throughout the procedure under my direct supervision. CONTRAST:  71mL VISIPAQUE IODIXANOL 320 MG/ML IV SOLN, 95mL VISIPAQUE IODIXANOL 320 MG/ML IV SOLN FLUOROSCOPY TIME:  Fluoroscopy Time: 39 minutes 30 seconds (189 mGy). COMPLICATIONS: None PROCEDURE: Informed consent was obtained from the patient following explanation of the procedure, risks, benefits and alternatives. The patient understands, agrees and consents for the procedure. All questions were addressed. A time out was performed prior to the initiation of the procedure. Maximal barrier sterile technique utilized including caps, mask, sterile gowns, sterile gloves, large sterile drape, hand hygiene, and Betadine prep. Ultrasound survey of the  left inguinal region was performed with images stored and sent to PACs, confirming  patency of the vessel. 1% lidocaine was used for local anesthesia. Small stab incision was made with 11 blade scalpel. Blunt dissection was performed. A micropuncture needle was used access the left common femoral artery under ultrasound. With excellent arterial blood flow returned, and an .018 micro wire was passed through the needle, observed enter the abdominal aorta under fluoroscopy. The needle was removed, and a micropuncture sheath was placed over the wire. The inner dilator and wire were removed, and an 035 glide wire was advanced under fluoroscopy into the abdominal aorta. The sheath was removed and a standard 5 [place] vascular sheath was placed. The dilator was removed and the sheath was flushed. Once the sheath was in place, red marrow injection was performed, confirming a narrowing of the iliac artery. Given that this would be impeding the progress of the sheath, potentially being occlusive, 6 mm balloon angioplasty was performed. Omni Flush catheter was then advanced to the abdominal aorta. Angiogram was performed of the pelvic arteries. Combination of the Omni Flush catheter and a C2 Cobra catheter used to navigate a Glidewire into the right external iliac artery. With the Glidewire position distally, the Cobra catheter was advanced into the common femoral artery. Glidewire was removed and a rose in wire was placed. Catheter was removed. Full heparinization was performed. Six French 45 cm terumo destination sheath was placed over the bifurcation into the external iliac artery. Angiogram of the right lower extremity was performed to the proximal tibial arteries. 90 cm Navicross catheter and a Glidewire were used to engage the stump of the native SFA. Never cross catheter advanced into the mid thigh, encountered calcifications within the mid thigh. Wire was removed and injection was performed demonstrating either subintimal or extravascular position of the catheter. The never cross catheter was not able  to be navigated into a more confident channel in the native SFA. Therefore we elected to puncture the anterior tibial artery and work backwards. 1% lidocaine was used to anesthetize the dorsum of the right foot. Micropuncture was used to access the dorsalis pedis. 018 V18 wire was passed retrograde through the dorsalis pedis and distal anterior tibial artery. A wire position in the distal anterior tibial artery was achieved and the needle was removed. An 018 crossing catheter was advanced bare back, with attempt at pushing the crossing catheter and wire retrograde. A combination of first and 018 command wire and then an 014 Glidewire were used to find the anterior tibial artery channel within the proximal third of the artery. Ultimately the quick cross catheter was impeded by calcifications in the tibial artery, without enough support to push the Glidewire retrograde. Exchange was then made again for the 014 command wire. The 014 command wire entered the proximal anterior tibial artery. We then upsized the crossing catheter to a 90 cm quick cross catheter. We were then successful with the 018 wire and the 035 catheter with advancing the catheter to the popliteal artery. The goal at this point was finding a similar channel between the catheter from above and the catheter from below. An 014 wire was passed retrograde through the length of the SFA. 2 mm balloon angioplasty was then performed along the entire length of the occluded anterior tibial artery, popliteal artery, and SFA. We were unable to run day view the wire from below with the never cross catheter from above, likely because of 2 subintimal planes. Rose in wire was  passed through the antegrade crossing catheter with 5 mm balloon angioplasty performed in the adductor canal. We then again attempted to rendezvous the microwire from below with the crossing catheter from above. This was unsuccessful given 2 different planes. Ultimately we push the crossing  catheter from the dorsalis pedis access retrograde into the common femoral artery and the external iliac artery. We were then successfully able to retrograde pass the command wire through the never cross catheter and externalized the wire at the left common femoral access. At this point, 5 mm balloon angioplasty was performed along the entire length of the SFA, popliteal artery. We then initiated stenting with placement of a 5 mm x 80 mm Supera stent. This was extended proximally with a 5 mm by 100 mm viabahn graft. We finally extended this with a 6 mm by 250 mm viabahn graft. 6 mm balloon angioplasty was performed as a post dilation along the entire length of the stent graft system as well as at the proximal superficial femoral artery. At this point, an 014150 cm crossing catheter was passed distally to the anterior tibial artery and the dorsalis pedis puncture site. The 014 command wire was removed and a 300 cm whisper wire was placed antegrade through the catheter. The catheter was removed. Balloon angioplasty was then performed along the length of the anterior tibial artery with a 3 mm by 220 mm coyote balloon. Balloon catheter was removed and a final angiogram was performed of the entire right lower extremity. Pulse was checked at the right ankle confirming restoration of the pulse. We then withdrew all catheters and wires, placed a Bentson wire into the left common femoral access site and deployed a 6 [place] Angio-Seal. Patient tolerated the procedure well and remained hemodynamically stable throughout. No complications were encountered and no significant blood loss. FINDINGS: Initial angiogram confirms a right SFA/popliteal artery occlusion, with no significant flow into the tibial arteries. 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 0% flow at the beginning of the  case with 100% improved perfusion at the conclusion. 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 Angio-Seal deployed for hemostasis. Signed, Dulcy Fanny. Dellia Nims, RPVI [facility] Radiology Electronically Signed   By: Corrie Mckusick D.O.   On: 09/16/2020 17:58   IR FEM POP ART PTA MOD SED  Result Date: 09/16/2020 INDICATION: 87 year old female presents with gangrene right forefoot, for attempt at revascularization femoropopliteal and tibial disease for palliation, given the imminent amputation EXAM: ULTRASOUND-GUIDED ACCESS LEFT COMMON FEMORAL ARTERY ULTRASOUND GUIDED ACCESS RIGHT DORSALIS PEDIS ANGIOGRAM OF THE PELVIS AND RIGHT LOWER EXTREMITY BALLOON ANGIOPLASTY AND STENT OF THE FEMOROPOPLITEAL SEGMENT, TREATING LONG SEGMENT CHRONIC TOTAL OCCLUSION BALLOON ANGIOPLASTY OF OCCLUDED ANTERIOR TIBIAL ARTERY ANGIO-SEAL FOR HEMOSTASIS MEDICATIONS: 2 g Ancef.  The antibiotic was administered during procedure 11000 units IV heparin ANESTHESIA/SEDATION: Moderate (conscious) sedation was employed during this procedure. A total of Versed 0.5 mg and Fentanyl 50 mcg was administered intravenously. Moderate Sedation Time: 165 minutes. The patient's level of consciousness and vital signs were monitored continuously by radiology nursing throughout the procedure under my direct supervision. CONTRAST:  18mL VISIPAQUE IODIXANOL 320 MG/ML  IV SOLN, 34mL VISIPAQUE IODIXANOL 320 MG/ML IV SOLN FLUOROSCOPY TIME:  Fluoroscopy Time: 39 minutes 30 seconds (189 mGy). COMPLICATIONS: None PROCEDURE: Informed consent was obtained from the patient following explanation of the procedure, risks, benefits and alternatives. The patient  understands, agrees and consents for the procedure. All questions were addressed. A time out was performed prior to the initiation of the procedure. Maximal barrier sterile technique utilized including caps, mask, sterile gowns, sterile gloves, large sterile drape, hand hygiene, and Betadine prep. Ultrasound survey of the left inguinal region was performed with images stored and sent to PACs, confirming patency of the vessel. 1% lidocaine was used for local anesthesia. Small stab incision was made with 11 blade scalpel. Blunt dissection was performed. A micropuncture needle was used access the left common femoral artery under ultrasound. With excellent arterial blood flow returned, and an .018 micro wire was passed through the needle, observed enter the abdominal aorta under fluoroscopy. The needle was removed, and a micropuncture sheath was placed over the wire. The inner dilator and wire were removed, and an 035 glide wire was advanced under fluoroscopy into the abdominal aorta. The sheath was removed and a standard 5 [place] vascular sheath was placed. The dilator was removed and the sheath was flushed. Once the sheath was in place, red marrow injection was performed, confirming a narrowing of the iliac artery. Given that this would be impeding the progress of the sheath, potentially being occlusive, 6 mm balloon angioplasty was performed. Omni Flush catheter was then advanced to the abdominal aorta. Angiogram was performed of the pelvic arteries. Combination of the Omni Flush catheter and a C2 Cobra catheter used to navigate a Glidewire into the right external iliac artery. With the Glidewire position distally, the Cobra catheter was advanced into the common femoral artery. Glidewire was removed and a rose in wire was placed. Catheter was removed. Full heparinization was performed. Six French 45 cm terumo destination sheath was placed over the bifurcation into the external iliac artery. Angiogram of the right  lower extremity was performed to the proximal tibial arteries. 90 cm Navicross catheter and a Glidewire were used to engage the stump of the native SFA. Never cross catheter advanced into the mid thigh, encountered calcifications within the mid thigh. Wire was removed and injection was performed demonstrating either subintimal or extravascular position of the catheter. The never cross catheter was not able to be navigated into a more confident channel in the native SFA. Therefore we elected to puncture the anterior tibial artery and work backwards. 1% lidocaine was used to anesthetize the dorsum of the right foot. Micropuncture was used to access the dorsalis pedis. 018 V18 wire was passed retrograde through the dorsalis pedis and distal anterior tibial artery. A wire position in the distal anterior tibial artery was achieved and the needle was removed. An 018 crossing catheter was advanced bare back, with attempt at pushing the crossing catheter and wire retrograde. A combination of first and 018 command wire and then an 014 Glidewire were used to find the anterior tibial artery channel within the proximal third of the artery. Ultimately the quick cross catheter was impeded by calcifications in the tibial artery, without enough support to push the Glidewire retrograde. Exchange was then made again for the 014 command wire. The 014 command wire entered the proximal anterior tibial artery. We then upsized the crossing catheter to a 90 cm quick cross catheter. We were then successful with the 018 wire and the 035 catheter with  advancing the catheter to the popliteal artery. The goal at this point was finding a similar channel between the catheter from above and the catheter from below. An 014 wire was passed retrograde through the length of the SFA. 2 mm balloon angioplasty was then performed along the entire length of the occluded anterior tibial artery, popliteal artery, and SFA. We were unable to run day view the  wire from below with the never cross catheter from above, likely because of 2 subintimal planes. Rose in wire was passed through the antegrade crossing catheter with 5 mm balloon angioplasty performed in the adductor canal. We then again attempted to rendezvous the microwire from below with the crossing catheter from above. This was unsuccessful given 2 different planes. Ultimately we push the crossing catheter from the dorsalis pedis access retrograde into the common femoral artery and the external iliac artery. We were then successfully able to retrograde pass the command wire through the never cross catheter and externalized the wire at the left common femoral access. At this point, 5 mm balloon angioplasty was performed along the entire length of the SFA, popliteal artery. We then initiated stenting with placement of a 5 mm x 80 mm Supera stent. This was extended proximally with a 5 mm by 100 mm viabahn graft. We finally extended this with a 6 mm by 250 mm viabahn graft. 6 mm balloon angioplasty was performed as a post dilation along the entire length of the stent graft system as well as at the proximal superficial femoral artery. At this point, an 014150 cm crossing catheter was passed distally to the anterior tibial artery and the dorsalis pedis puncture site. The 014 command wire was removed and a 300 cm whisper wire was placed antegrade through the catheter. The catheter was removed. Balloon angioplasty was then performed along the length of the anterior tibial artery with a 3 mm by 220 mm coyote balloon. Balloon catheter was removed and a final angiogram was performed of the entire right lower extremity. Pulse was checked at the right ankle confirming restoration of the pulse. We then withdrew all catheters and wires, placed a Bentson wire into the left common femoral access site and deployed a 6 [place] Angio-Seal. Patient tolerated the procedure well and remained hemodynamically stable throughout. No  complications were encountered and no significant blood loss. FINDINGS: Initial angiogram confirms a right SFA/popliteal artery occlusion, with no significant flow into the tibial arteries. 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 0% flow at the beginning of the case with 100% improved perfusion at the conclusion. 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 Angio-Seal deployed for hemostasis. Signed, Dulcy Fanny. Dellia Nims, RPVI [facility] Radiology Electronically Signed   By: Corrie Mckusick D.O.   On: 09/16/2020 17:58   IR FEM POP ART STENT INC PTA MOD SED  Result Date: 09/16/2020 INDICATION: 87 year old female presents with gangrene right forefoot, for attempt at revascularization femoropopliteal and tibial disease for palliation, given the imminent amputation EXAM: ULTRASOUND-GUIDED ACCESS LEFT COMMON FEMORAL ARTERY ULTRASOUND GUIDED ACCESS RIGHT DORSALIS PEDIS ANGIOGRAM OF THE PELVIS AND RIGHT LOWER EXTREMITY BALLOON ANGIOPLASTY AND STENT  OF THE FEMOROPOPLITEAL SEGMENT, TREATING LONG SEGMENT CHRONIC TOTAL OCCLUSION BALLOON ANGIOPLASTY OF OCCLUDED ANTERIOR TIBIAL ARTERY ANGIO-SEAL FOR HEMOSTASIS MEDICATIONS: 2 g Ancef.  The antibiotic was administered during procedure 11000 units IV heparin ANESTHESIA/SEDATION: Moderate (conscious) sedation was employed during this procedure. A total of Versed 0.5 mg and Fentanyl 50 mcg was administered intravenously. Moderate Sedation Time: 165 minutes. The patient's level of consciousness and vital signs were monitored  continuously by radiology nursing throughout the procedure under my direct supervision. CONTRAST:  22mL VISIPAQUE IODIXANOL 320 MG/ML IV SOLN, 22mL VISIPAQUE IODIXANOL 320 MG/ML IV SOLN FLUOROSCOPY TIME:  Fluoroscopy Time: 39 minutes 30 seconds (189 mGy). COMPLICATIONS: None PROCEDURE: Informed consent was obtained from the patient following explanation of the procedure, risks, benefits and alternatives. The patient understands, agrees and consents for the procedure. All questions were addressed. A time out was performed prior to the initiation of the procedure. Maximal barrier sterile technique utilized including caps, mask, sterile gowns, sterile gloves, large sterile drape, hand hygiene, and Betadine prep. Ultrasound survey of the left inguinal region was performed with images stored and sent to PACs, confirming patency of the vessel. 1% lidocaine was used for local anesthesia. Small stab incision was made with 11 blade scalpel. Blunt dissection was performed. A micropuncture needle was used access the left common femoral artery under ultrasound. With excellent arterial blood flow returned, and an .018 micro wire was passed through the needle, observed enter the abdominal aorta under fluoroscopy. The needle was removed, and a micropuncture sheath was placed over the wire. The inner dilator and wire were removed, and an 035 glide wire was advanced under fluoroscopy into the abdominal aorta. The sheath was removed and a standard 5 [place] vascular sheath was placed. The dilator was removed and the sheath was flushed. Once the sheath was in place, red marrow injection was performed, confirming a narrowing of the iliac artery. Given that this would be impeding the progress of the sheath, potentially being occlusive, 6 mm balloon angioplasty was performed. Omni Flush catheter was then advanced to the abdominal aorta. Angiogram was performed of the pelvic arteries. Combination of the Omni Flush catheter and a C2 Cobra  catheter used to navigate a Glidewire into the right external iliac artery. With the Glidewire position distally, the Cobra catheter was advanced into the common femoral artery. Glidewire was removed and a rose in wire was placed. Catheter was removed. Full heparinization was performed. Six French 45 cm terumo destination sheath was placed over the bifurcation into the external iliac artery. Angiogram of the right lower extremity was performed to the proximal tibial arteries. 90 cm Navicross catheter and a Glidewire were used to engage the stump of the native SFA. Never cross catheter advanced into the mid thigh, encountered calcifications within the mid thigh. Wire was removed and injection was performed demonstrating either subintimal or extravascular position of the catheter. The never cross catheter was not able to be navigated into a more confident channel in the native SFA. Therefore we elected to puncture the anterior tibial artery and work backwards. 1% lidocaine was used to anesthetize the dorsum of the right foot. Micropuncture was used to access the dorsalis pedis. 018 V18 wire was passed retrograde through the dorsalis pedis and distal anterior tibial artery. A wire position in the distal anterior tibial artery was achieved and the needle was removed. An 018 crossing catheter was advanced bare back, with attempt at pushing the crossing catheter and wire retrograde. A combination of first and 018  command wire and then an 014 Glidewire were used to find the anterior tibial artery channel within the proximal third of the artery. Ultimately the quick cross catheter was impeded by calcifications in the tibial artery, without enough support to push the Glidewire retrograde. Exchange was then made again for the 014 command wire. The 014 command wire entered the proximal anterior tibial artery. We then upsized the crossing catheter to a 90 cm quick cross catheter. We were then successful with the 018 wire and  the 035 catheter with advancing the catheter to the popliteal artery. The goal at this point was finding a similar channel between the catheter from above and the catheter from below. An 014 wire was passed retrograde through the length of the SFA. 2 mm balloon angioplasty was then performed along the entire length of the occluded anterior tibial artery, popliteal artery, and SFA. We were unable to run day view the wire from below with the never cross catheter from above, likely because of 2 subintimal planes. Rose in wire was passed through the antegrade crossing catheter with 5 mm balloon angioplasty performed in the adductor canal. We then again attempted to rendezvous the microwire from below with the crossing catheter from above. This was unsuccessful given 2 different planes. Ultimately we push the crossing catheter from the dorsalis pedis access retrograde into the common femoral artery and the external iliac artery. We were then successfully able to retrograde pass the command wire through the never cross catheter and externalized the wire at the left common femoral access. At this point, 5 mm balloon angioplasty was performed along the entire length of the SFA, popliteal artery. We then initiated stenting with placement of a 5 mm x 80 mm Supera stent. This was extended proximally with a 5 mm by 100 mm viabahn graft. We finally extended this with a 6 mm by 250 mm viabahn graft. 6 mm balloon angioplasty was performed as a post dilation along the entire length of the stent graft system as well as at the proximal superficial femoral artery. At this point, an 014150 cm crossing catheter was passed distally to the anterior tibial artery and the dorsalis pedis puncture site. The 014 command wire was removed and a 300 cm whisper wire was placed antegrade through the catheter. The catheter was removed. Balloon angioplasty was then performed along the length of the anterior tibial artery with a 3 mm by 220 mm coyote  balloon. Balloon catheter was removed and a final angiogram was performed of the entire right lower extremity. Pulse was checked at the right ankle confirming restoration of the pulse. We then withdrew all catheters and wires, placed a Bentson wire into the left common femoral access site and deployed a 6 [place] Angio-Seal. Patient tolerated the procedure well and remained hemodynamically stable throughout. No complications were encountered and no significant blood loss. FINDINGS: Initial angiogram confirms a right SFA/popliteal artery occlusion, with no significant flow into the tibial arteries. 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 0% flow at the beginning of the case with 100% improved perfusion at the conclusion. 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 Angio-Seal deployed for  hemostasis. Signed, Dulcy Fanny. Dellia Nims, RPVI [facility] Radiology Electronically Signed   By: Corrie Mckusick D.O.   On: 09/16/2020 17:58   IR US Guide Vasc Access Left  Result Date: 09/16/2020 INDICATION: 87 year old female presents with gangrene right forefoot, for attempt at revascularization femoropopliteal and tibial disease for palliation, given the imminent amputation EXAM: ULTRASOUND-GUIDED ACCESS LEFT COMMON FEMORAL ARTERY ULTRASOUND GUIDED ACCESS RIGHT DORSALIS PEDIS ANGIOGRAM OF THE PELVIS AND RIGHT LOWER EXTREMITY BALLOON ANGIOPLASTY AND STENT OF THE FEMOROPOPLITEAL SEGMENT, TREATING LONG SEGMENT CHRONIC TOTAL OCCLUSION BALLOON ANGIOPLASTY OF OCCLUDED ANTERIOR TIBIAL  ARTERY ANGIO-SEAL FOR HEMOSTASIS MEDICATIONS: 2 g Ancef.  The antibiotic was administered during procedure 11000 units IV heparin ANESTHESIA/SEDATION: Moderate (conscious) sedation was employed during this procedure. A total of Versed 0.5 mg and Fentanyl 50 mcg was administered intravenously. Moderate Sedation Time: 165 minutes. The patient's level of consciousness and vital signs were monitored continuously by radiology nursing throughout the procedure under my direct supervision. CONTRAST:  62mL VISIPAQUE IODIXANOL 320 MG/ML IV SOLN, 33mL VISIPAQUE IODIXANOL 320 MG/ML IV SOLN FLUOROSCOPY TIME:  Fluoroscopy Time: 39 minutes 30 seconds (189 mGy). COMPLICATIONS: None PROCEDURE: Informed consent was obtained from the patient following explanation of the procedure, risks, benefits and alternatives. The patient understands, agrees and consents for the procedure. All questions were addressed. A time out was performed prior to the initiation of the procedure. Maximal barrier sterile technique utilized including caps, mask, sterile gowns, sterile gloves, large sterile drape, hand hygiene, and Betadine prep. Ultrasound survey of the left inguinal region was performed with images stored and sent to PACs, confirming patency of the vessel. 1% lidocaine was used for local anesthesia. Small stab incision was made with 11 blade scalpel. Blunt dissection was performed. A micropuncture needle was used access the left common femoral artery under ultrasound. With excellent arterial blood flow returned, and an .018 micro wire was passed through the needle, observed enter the abdominal aorta under fluoroscopy. The needle was removed, and a micropuncture sheath was placed over the wire. The inner dilator and wire were removed, and an 035 glide wire was advanced under fluoroscopy into the abdominal aorta. The sheath was removed and a standard 5 [place] vascular sheath was placed. The dilator was removed and the sheath was flushed. Once  the sheath was in place, red marrow injection was performed, confirming a narrowing of the iliac artery. Given that this would be impeding the progress of the sheath, potentially being occlusive, 6 mm balloon angioplasty was performed. Omni Flush catheter was then advanced to the abdominal aorta. Angiogram was performed of the pelvic arteries. Combination of the Omni Flush catheter and a C2 Cobra catheter used to navigate a Glidewire into the right external iliac artery. With the Glidewire position distally, the Cobra catheter was advanced into the common femoral artery. Glidewire was removed and a rose in wire was placed. Catheter was removed. Full heparinization was performed. Six French 45 cm terumo destination sheath was placed over the bifurcation into the external iliac artery. Angiogram of the right lower extremity was performed to the proximal tibial arteries. 90 cm Navicross catheter and a Glidewire were used to engage the stump of the native SFA. Never cross catheter advanced into the mid thigh, encountered calcifications within the mid thigh. Wire was removed and injection was performed demonstrating either subintimal or extravascular position of the catheter. The never cross catheter was not able to be navigated into a more confident channel in the native SFA.  Therefore we elected to puncture the anterior tibial artery and work backwards. 1% lidocaine was used to anesthetize the dorsum of the right foot. Micropuncture was used to access the dorsalis pedis. 018 V18 wire was passed retrograde through the dorsalis pedis and distal anterior tibial artery. A wire position in the distal anterior tibial artery was achieved and the needle was removed. An 018 crossing catheter was advanced bare back, with attempt at pushing the crossing catheter and wire retrograde. A combination of first and 018 command wire and then an 014 Glidewire were used to find the anterior tibial artery channel within the proximal third  of the artery. Ultimately the quick cross catheter was impeded by calcifications in the tibial artery, without enough support to push the Glidewire retrograde. Exchange was then made again for the 014 command wire. The 014 command wire entered the proximal anterior tibial artery. We then upsized the crossing catheter to a 90 cm quick cross catheter. We were then successful with the 018 wire and the 035 catheter with advancing the catheter to the popliteal artery. The goal at this point was finding a similar channel between the catheter from above and the catheter from below. An 014 wire was passed retrograde through the length of the SFA. 2 mm balloon angioplasty was then performed along the entire length of the occluded anterior tibial artery, popliteal artery, and SFA. We were unable to run day view the wire from below with the never cross catheter from above, likely because of 2 subintimal planes. Rose in wire was passed through the antegrade crossing catheter with 5 mm balloon angioplasty performed in the adductor canal. We then again attempted to rendezvous the microwire from below with the crossing catheter from above. This was unsuccessful given 2 different planes. Ultimately we push the crossing catheter from the dorsalis pedis access retrograde into the common femoral artery and the external iliac artery. We were then successfully able to retrograde pass the command wire through the never cross catheter and externalized the wire at the left common femoral access. At this point, 5 mm balloon angioplasty was performed along the entire length of the SFA, popliteal artery. We then initiated stenting with placement of a 5 mm x 80 mm Supera stent. This was extended proximally with a 5 mm by 100 mm viabahn graft. We finally extended this with a 6 mm by 250 mm viabahn graft. 6 mm balloon angioplasty was performed as a post dilation along the entire length of the stent graft system as well as at the proximal  superficial femoral artery. At this point, an 014150 cm crossing catheter was passed distally to the anterior tibial artery and the dorsalis pedis puncture site. The 014 command wire was removed and a 300 cm whisper wire was placed antegrade through the catheter. The catheter was removed. Balloon angioplasty was then performed along the length of the anterior tibial artery with a 3 mm by 220 mm coyote balloon. Balloon catheter was removed and a final angiogram was performed of the entire right lower extremity. Pulse was checked at the right ankle confirming restoration of the pulse. We then withdrew all catheters and wires, placed a Bentson wire into the left common femoral access site and deployed a 6 [place] Angio-Seal. Patient tolerated the procedure well and remained hemodynamically stable throughout. No complications were encountered and no significant blood loss. FINDINGS: Initial angiogram confirms a right SFA/popliteal artery occlusion, with no significant flow into the tibial arteries. After treatment of the chronic  total occlusion of the SFA and popliteal artery as well as treatment of the occluded anterior tibial artery there is restoration of inline flow to the foot. 0% flow at the beginning of the case with 100% improved perfusion at the conclusion. 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 Angio-Seal deployed for hemostasis. Signed, Dulcy Fanny. Dellia Nims, RPVI [facility] Radiology Electronically Signed   By: Corrie Mckusick D.O.   On: 09/16/2020 17:58   IR US Guide Vasc Access Right  Result Date: 09/16/2020 INDICATION: 87 year old female presents with gangrene right forefoot, for attempt at  revascularization femoropopliteal and tibial disease for palliation, given the imminent amputation EXAM: ULTRASOUND-GUIDED ACCESS LEFT COMMON FEMORAL ARTERY ULTRASOUND GUIDED ACCESS RIGHT DORSALIS PEDIS ANGIOGRAM OF THE PELVIS AND RIGHT LOWER EXTREMITY BALLOON ANGIOPLASTY AND STENT OF THE FEMOROPOPLITEAL SEGMENT, TREATING LONG SEGMENT CHRONIC TOTAL OCCLUSION BALLOON ANGIOPLASTY OF OCCLUDED ANTERIOR TIBIAL ARTERY ANGIO-SEAL FOR HEMOSTASIS MEDICATIONS: 2 g Ancef.  The antibiotic was administered during procedure 11000 units IV heparin ANESTHESIA/SEDATION: Moderate (conscious) sedation was employed during this procedure. A total of Versed 0.5 mg and Fentanyl 50 mcg was administered intravenously. Moderate Sedation Time: 165 minutes. The patient's level of consciousness and vital signs were monitored continuously by radiology nursing throughout the procedure under my direct supervision. CONTRAST:  62mL VISIPAQUE IODIXANOL 320 MG/ML IV SOLN, 37mL VISIPAQUE IODIXANOL 320 MG/ML IV SOLN FLUOROSCOPY TIME:  Fluoroscopy Time: 39 minutes 30 seconds (189 mGy). COMPLICATIONS: None PROCEDURE: Informed consent was obtained from the patient following explanation of the procedure, risks, benefits and alternatives. The patient understands, agrees and consents for the procedure. All questions were addressed. A time out was performed prior to the initiation of the procedure. Maximal barrier sterile technique utilized including caps, mask, sterile gowns, sterile gloves, large sterile drape, hand hygiene, and Betadine prep. Ultrasound survey of the left inguinal region was performed with images stored and sent to PACs, confirming patency of the vessel. 1% lidocaine was used for local anesthesia. Small stab incision was made with 11 blade scalpel. Blunt dissection was performed. A micropuncture needle was used access the left common femoral artery under ultrasound. With excellent arterial blood flow returned, and an .018 micro wire was  passed through the needle, observed enter the abdominal aorta under fluoroscopy. The needle was removed, and a micropuncture sheath was placed over the wire. The inner dilator and wire were removed, and an 035 glide wire was advanced under fluoroscopy into the abdominal aorta. The sheath was removed and a standard 5 [place] vascular sheath was placed. The dilator was removed and the sheath was flushed. Once the sheath was in place, red marrow injection was performed, confirming a narrowing of the iliac artery. Given that this would be impeding the progress of the sheath, potentially being occlusive, 6 mm balloon angioplasty was performed. Omni Flush catheter was then advanced to the abdominal aorta. Angiogram was performed of the pelvic arteries. Combination of the Omni Flush catheter and a C2 Cobra catheter used to navigate a Glidewire into the right external iliac artery. With the Glidewire position distally, the Cobra catheter was advanced into the common femoral artery. Glidewire was removed and a rose in wire was placed. Catheter was removed. Full heparinization was performed. Six French 45 cm terumo destination sheath was placed  over the bifurcation into the external iliac artery. Angiogram of the right lower extremity was performed to the proximal tibial arteries. 90 cm Navicross catheter and a Glidewire were used to engage the stump of the native SFA. Never cross catheter advanced into the mid thigh, encountered calcifications within the mid thigh. Wire was removed and injection was performed demonstrating either subintimal or extravascular position of the catheter. The never cross catheter was not able to be navigated into a more confident channel in the native SFA. Therefore we elected to puncture the anterior tibial artery and work backwards. 1% lidocaine was used to anesthetize the dorsum of the right foot. Micropuncture was used to access the dorsalis pedis. 018 V18 wire was passed retrograde through the  dorsalis pedis and distal anterior tibial artery. A wire position in the distal anterior tibial artery was achieved and the needle was removed. An 018 crossing catheter was advanced bare back, with attempt at pushing the crossing catheter and wire retrograde. A combination of first and 018 command wire and then an 014 Glidewire were used to find the anterior tibial artery channel within the proximal third of the artery. Ultimately the quick cross catheter was impeded by calcifications in the tibial artery, without enough support to push the Glidewire retrograde. Exchange was then made again for the 014 command wire. The 014 command wire entered the proximal anterior tibial artery. We then upsized the crossing catheter to a 90 cm quick cross catheter. We were then successful with the 018 wire and the 035 catheter with advancing the catheter to the popliteal artery. The goal at this point was finding a similar channel between the catheter from above and the catheter from below. An 014 wire was passed retrograde through the length of the SFA. 2 mm balloon angioplasty was then performed along the entire length of the occluded anterior tibial artery, popliteal artery, and SFA. We were unable to run day view the wire from below with the never cross catheter from above, likely because of 2 subintimal planes. Rose in wire was passed through the antegrade crossing catheter with 5 mm balloon angioplasty performed in the adductor canal. We then again attempted to rendezvous the microwire from below with the crossing catheter from above. This was unsuccessful given 2 different planes. Ultimately we push the crossing catheter from the dorsalis pedis access retrograde into the common femoral artery and the external iliac artery. We were then successfully able to retrograde pass the command wire through the never cross catheter and externalized the wire at the left common femoral access. At this point, 5 mm balloon angioplasty was  performed along the entire length of the SFA, popliteal artery. We then initiated stenting with placement of a 5 mm x 80 mm Supera stent. This was extended proximally with a 5 mm by 100 mm viabahn graft. We finally extended this with a 6 mm by 250 mm viabahn graft. 6 mm balloon angioplasty was performed as a post dilation along the entire length of the stent graft system as well as at the proximal superficial femoral artery. At this point, an 014150 cm crossing catheter was passed distally to the anterior tibial artery and the dorsalis pedis puncture site. The 014 command wire was removed and a 300 cm whisper wire was placed antegrade through the catheter. The catheter was removed. Balloon angioplasty was then performed along the length of the anterior tibial artery with a 3 mm by 220 mm coyote balloon. Balloon catheter was removed and a  final angiogram was performed of the entire right lower extremity. Pulse was checked at the right ankle confirming restoration of the pulse. We then withdrew all catheters and wires, placed a Bentson wire into the left common femoral access site and deployed a 6 [place] Angio-Seal. Patient tolerated the procedure well and remained hemodynamically stable throughout. No complications were encountered and no significant blood loss. FINDINGS: Initial angiogram confirms a right SFA/popliteal artery occlusion, with no significant flow into the tibial arteries. 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 0% flow at the beginning of the case with 100% improved perfusion at the conclusion. IMPRESSION: Status post ultrasound-guided access left common femoral artery and right dorsalis pedis for balloon angioplasty and stenting of right femoropopliteal chronic total occlusion, balloon angioplasty of occluded right anterior tibial artery, restoring  in-line flow to the foot for treatment of cli. Angio-Seal deployed for hemostasis. Signed, Dulcy Fanny. Dellia Nims, RPVI [facility] Radiology Special[facility] Radiology Electronically Signed   By: Corrie Mckusick D.O.   On: 09/16/2020 17:58   DG Chest Port 1 View  Result Date: October 17, 2020 CLINICAL DATA:  Sepsis.  Necrotic toes EXAM: PORTABLE CHEST 1 VIEW COMPARISON:  08/25/2020 FINDINGS: Stable enlarged cardiac silhouette. No focal infiltrate. No pleural fluid. No pneumothorax. Mild central venous congestion. No acute osseous abnormality. IMPRESSION: Cardiomegaly and central venous congestion. No infiltrate identified. Electronically Signed   By: Suzy Bouchard M.D.   On: Oct 17, 2020 10:20    Procedures .Critical Care Performed by: Hayden Rasmussen, MD Authorized by: Hayden Rasmussen, MD   Critical care provider statement:    Critical care time (minutes):  90   Critical care time was exclusive of:  Separately billable procedures and treating other patients   Critical care was necessary to treat or prevent imminent or life-threatening deterioration of the following conditions:  Circulatory failure, sepsis, shock, metabolic crisis, hepatic failure and respiratory failure   Critical care was time spent personally by me on the following activities:  Discussions with consultants, evaluation of patient's response to treatment, examination of patient, ordering and performing treatments and interventions, ordering and review of laboratory studies, ordering and review of radiographic studies, pulse oximetry, re-evaluation of patient's condition, obtaining history from patient or surrogate, review of old charts and development of treatment plan with patient or surrogate Ultrasound ED Echo  Date/Time: 2020-10-17 11:15 AM Performed by: Hayden Rasmussen, MD Authorized by: Hayden Rasmussen, MD   Procedure details:    Indications: cardiac arrest     Views: parasternal long axis view,  parasternal short axis view and apical 4 chamber view     Images: not archived   Findings:    Pericardium: no pericardial effusion     Cardiac Activity: normal cardiac activity and no cardiac activity   Impression:    Impression: abnormal cardiac activity       Medications Ordered in ED Medications  lactated ringers infusion (has no administration in time range)  vancomycin (VANCOCIN) IVPB 1000 mg/200 mL premix (has no administration in time range)  ceFEPIme (MAXIPIME) 2 g in sodium chloride 0.9 % 100 mL IVPB (has no administration in time range)  vancomycin variable dose per unstable renal function (pharmacist dosing) (has no administration in time range)  lactated ringers bolus 1,000 mL (0 mLs Intravenous Stopped 10-17-20 1111)    And  lactated ringers  bolus 1,000 mL (0 mLs Intravenous Stopped October 17, 2020 1111)  ceFEPIme (MAXIPIME) 2 g in sodium chloride 0.9 % 100 mL IVPB (0 g Intravenous Stopped 10-17-2020 1111)  metroNIDAZOLE (FLAGYL) IVPB 500 mg (0 mg Intravenous Stopped 10/17/2020 1111)  dextrose 50 % solution (  Given 2020-10-17 0957)  sodium bicarbonate injection 50 mEq (50 mEq Intravenous Given October 17, 2020 1017)    ED Course  I have reviewed the triage vital signs and the nursing notes.  Pertinent labs & imaging results that were available during my care of the patient were reviewed by me and considered in my medical decision making (see chart for details).  Clinical Course as of 10/17/20 1728  Sat 2020-10-17  7829 Procedure note from interventional radiology done yesterday - US guided left CFA access. US guided right DP access  Balloon angioplasty and stenting of CTO of right SFA/popliteal artery, Balloon angioplasty of right AT with PTA.   Restoration of flow to the right foot via patent fem-pop and AT to the ankle.  Small vessel disease of the forefoot. [MB]  [id number] Past medical history looks to be A. fib, COPD, hypertension, not on anticoagulation [MB]  [phone] Discussed with on-call  interventional radiology.  He reviewed the procedure note.  Michela Pitcher most concerning would be a retroperitoneal bleed and also to consider sepsis from improved perfusion of lower extremity gangrene that may have translocated some bacteria. [MB]  [id number] Antibiotics and IV fluids ordered for sepsis.  Bear hugger applied for hypothermia.  Warmed IV fluids. [MB]  3086 I-STAT showing venous pH of 7.03 with low PCO2 reflecting some metabolic acidosis.  Chemistry showing critical low glucose of 27, D50 ordered. [MB]  5784 Discussed with patient's daughter Grier Mitts who is listed at her contact.  She is in [place] but try to get a plane back.  She says the patient is a DNR.  There is another daughter who is local who is coming to the hospital now. [MB]  1013 Discussed with critical care. They will be down to see the patient. Agree with current management and likely trending towards comfort care. [MB]  1020 Patient having increased ectopy. Ordered amp of bicarb. [MB]  6962 Called to bedside for rhythm change.  Patient is wide-complex bradycardia rate of 25.  No pulses.  No respiratory effort.  No neurologic signs.  Bedside ultrasound showed minimal contractility. [MB]  9528 Chemistry coming back with a glucose of 33 which was intervened on already.  ALT elevated.  Bicarb less than 7 and lactate greater than 11.  Likely overwhelming sepsis shock liver. [MB]  4132 Patient pronounced at 10:50 AM.  Bedside ultrasound shows no cardiac activity.  No pulses.  I informed patient's daughter Velda Shell and she said her sister is still coming up.  Do not feel this is an ME case with overwhelming sepsis.  I will sign the death certificate. [MB]  4401 Patient's other daughter Chong Sicilian here with her significant other to see their mother.  I updated them on what I thought happened and answered questions as best I could. [MB]    Clinical Course User Index [MB] Hayden Rasmussen, MD   MDM Rules/Calculators/A&P                          This patient complains of shock in the setting of recent vascular procedure; this involves an extensive number of treatment Options and is a complaint that carries with it a high risk  of complications and Morbidity. The differential includes sepsis, Sirs, retroperitoneal bleed, vascular injury, metabolic derangement, ACS, pneumonia, PE, head bleed  I ordered, reviewed and interpreted labs, which included CBC with markedly elevated white count, low hemoglobin stable from priors, normal platelets, chemistries with hyperkalemia and new renal failure low glucose, lactic acid markedly elevated greater than 11, Covid testing negative, VBG with low pH low bicarb consistent with metabolic acidosis I ordered medication IV fluids IV antibiotics IV glucose IV bicarb I ordered imaging studies which included chest x-ray and I independently    visualized and interpreted imaging which showed cardiomegaly no acute infiltrates no pneumothorax Additional history obtained from EMS, patient's family members Previous records obtained and reviewed in epic including op note done yesterday I consulted interventional radiology and critical care and discussed lab and imaging findings  Critical Interventions: Work-up and management of patient's shock with early antibiotics and IV fluids.  After the interventions stated above, I reevaluated the patient and found patient still to be critically unstable.  Ultimately succumbed to PEA arrest in the setting of sepsis and hypotension.   Final Clinical Impression(s) / ED Diagnoses Final diagnoses:  Septic shock [facility])    Rx / DC Orders ED Discharge Orders    None       Hayden Rasmussen, MD Oct 02, 2020 1733

## 2020-09-20 NOTE — ED Triage Notes (Signed)
Pt BIBA from home with increased AMS. Pt has PVD with an attempted graft placed to the right foot. Right toes are black/necrotic. Pt is able to state her name. BP 70 systolic and hypoxic at 12% on RA.

## 2020-09-20 NOTE — ED Notes (Signed)
Bair hugger in place with warm LR bolus

## 2020-09-20 DEATH — deceased

## 2020-09-21 ENCOUNTER — Other Ambulatory Visit (HOSPITAL_COMMUNITY): Payer: Self-pay | Admitting: Interventional Radiology

## 2020-09-21 ENCOUNTER — Encounter (HOSPITAL_COMMUNITY): Payer: Self-pay

## 2020-09-21 DIAGNOSIS — I96 Gangrene, not elsewhere classified: Secondary | ICD-10-CM

## 2020-09-22 LAB — CULTURE, BLOOD (ROUTINE X 2): Culture: NO GROWTH

## 2021-11-28 IMAGING — US IR ANGIO/EXT/UNI*R*
1 series · 3 of 3 positions shown · non-contrast
Comparison: none

INDICATION: [AGE] female presents with gangrene right forefoot, for
attempt at revascularization femoropopliteal and tibial disease for
palliation, given the imminent amputation

[Series 1: ir angio/ext/uni*right* · 3 of 3 slices shown]
[im 1/3]
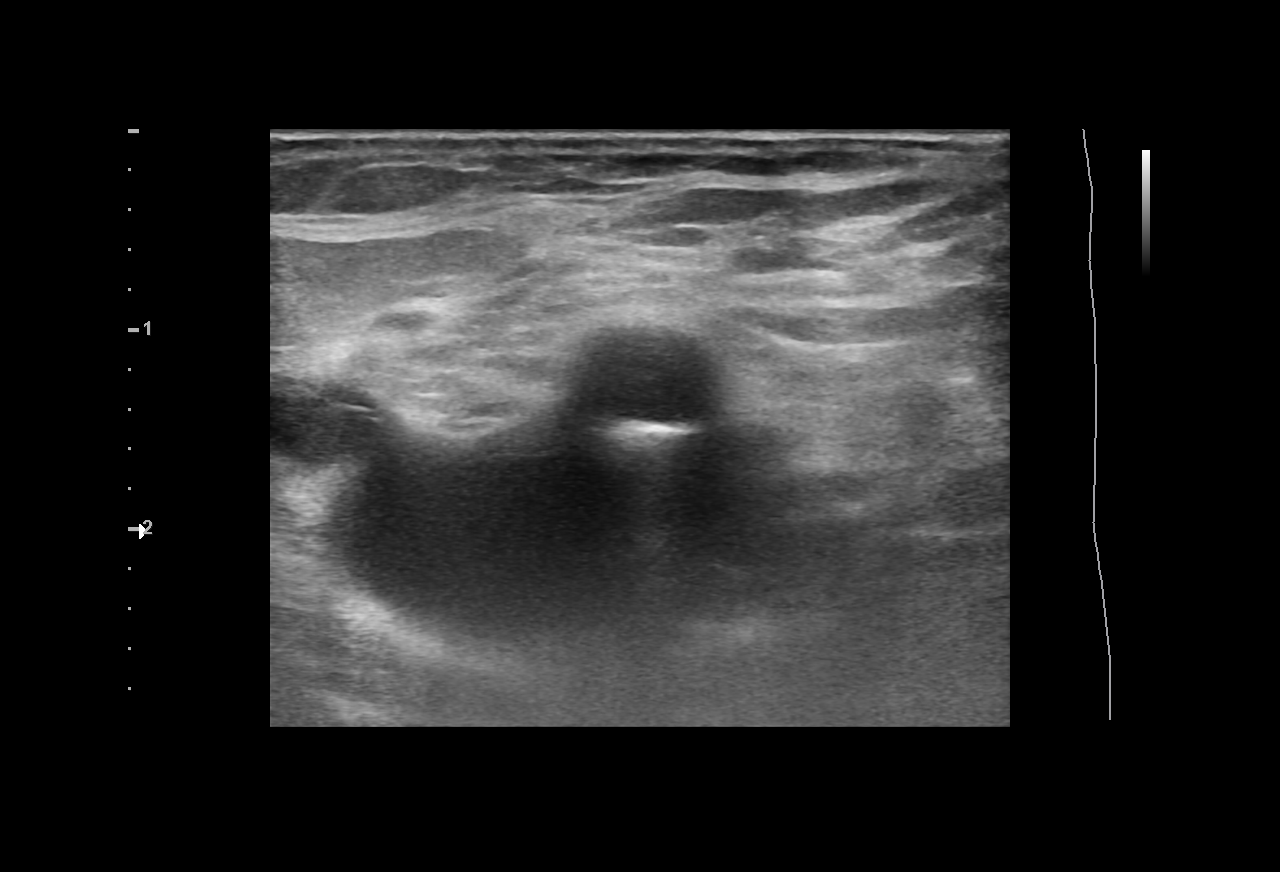
[im 2/3]
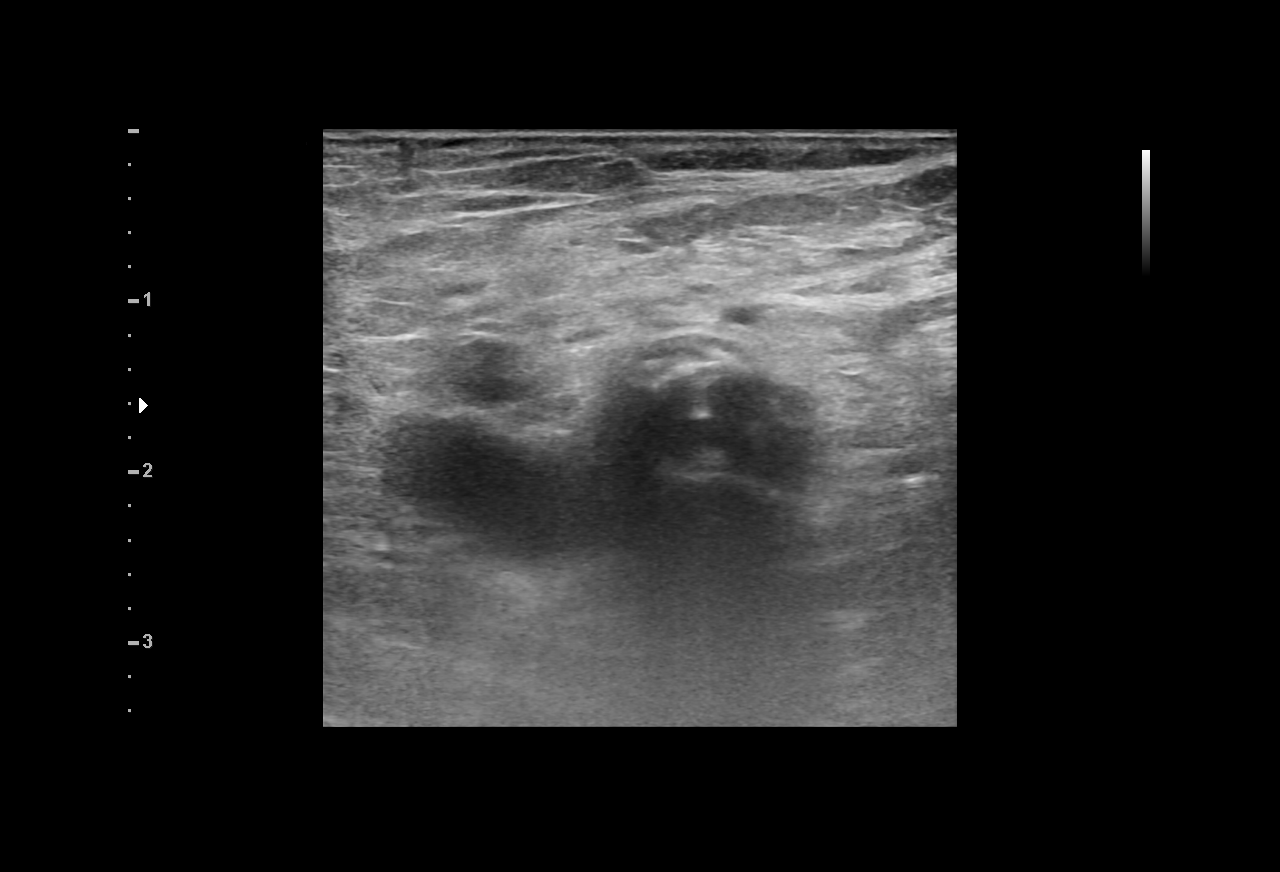
[im 3/3]
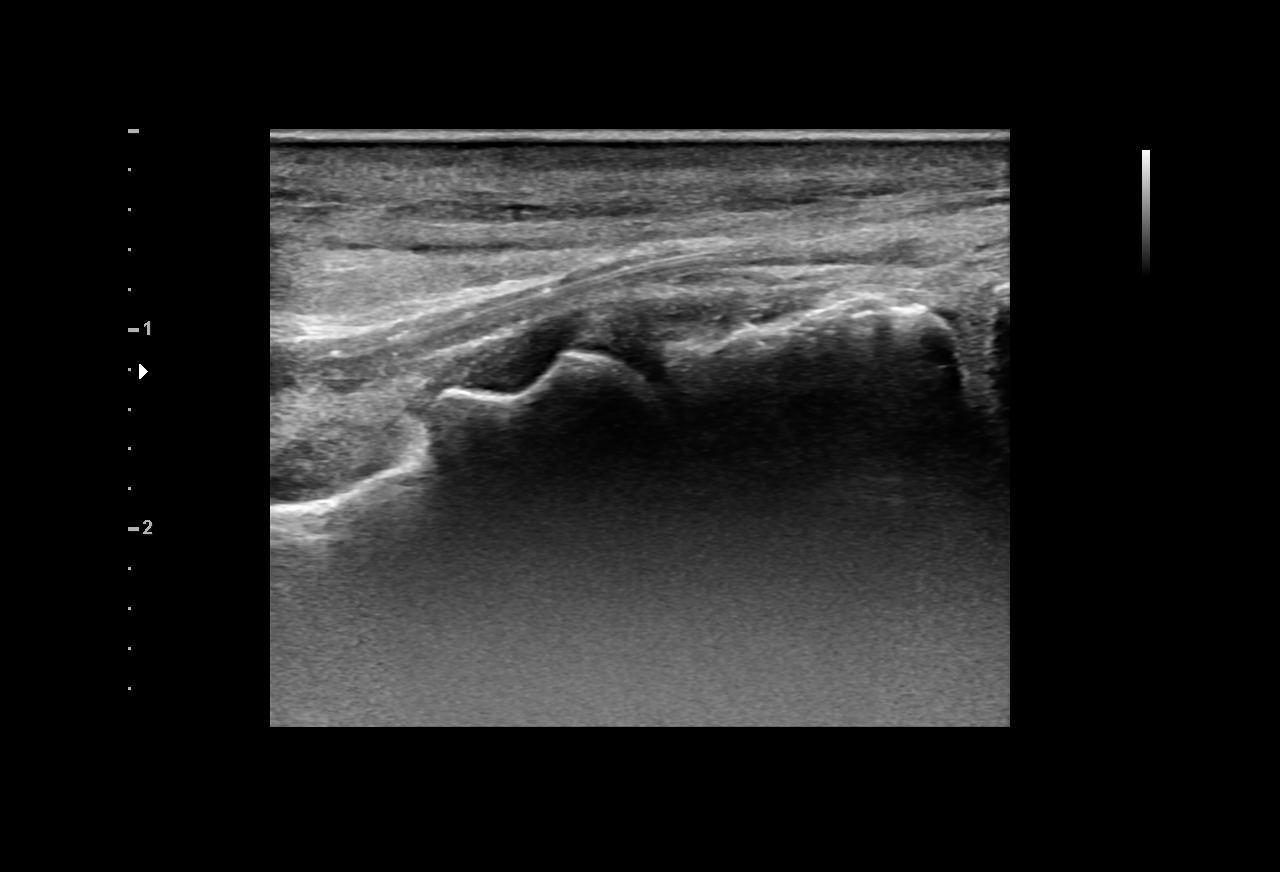

[3 of 3 positions shown; findings below may reference images not displayed]

EXAM:
ULTRASOUND-GUIDED ACCESS LEFT COMMON FEMORAL ARTERY

ULTRASOUND GUIDED ACCESS RIGHT DORSALIS PEDIS

ANGIOGRAM OF THE PELVIS AND RIGHT LOWER EXTREMITY

BALLOON ANGIOPLASTY AND STENT OF THE FEMOROPOPLITEAL SEGMENT,
TREATING LONG SEGMENT CHRONIC TOTAL OCCLUSION

BALLOON ANGIOPLASTY OF OCCLUDED ANTERIOR TIBIAL ARTERY

ANGIO-SEAL FOR HEMOSTASIS

MEDICATIONS:
2 g Ancef.  The antibiotic was administered during procedure

11666 units IV heparin

ANESTHESIA/SEDATION:
Moderate (conscious) sedation was employed during this procedure. A
total of Versed 0.5 mg and Fentanyl 50 mcg was administered
intravenously.

Moderate Sedation Time: 165 minutes. The patient's level of
consciousness and vital signs were monitored continuously by
radiology nursing throughout the procedure under my direct
supervision.

CONTRAST:  50mL VISIPAQUE IODIXANOL 320 MG/ML IV SOLN, 65mL
VISIPAQUE IODIXANOL 320 MG/ML IV SOLN

FLUOROSCOPY TIME:  Fluoroscopy Time: 39 minutes 30 seconds (189
mGy).

COMPLICATIONS:
None



Ultrasound survey of the left inguinal region was performed with
images stored and sent to PACs, confirming patency of the vessel.

1% lidocaine was used for local anesthesia. Small stab incision was
made with 11 blade scalpel. Blunt dissection was performed. A
micropuncture needle was used access the left common femoral artery
under ultrasound. With excellent arterial blood flow returned, and
an .018 micro wire was passed through the needle, observed enter the
abdominal aorta under fluoroscopy. The needle was removed, and a
micropuncture sheath was placed over the wire. The inner dilator and
wire were removed, and an 035 glide wire was advanced under
fluoroscopy into the abdominal aorta. The sheath was removed and a
standard 5 [place] vascular sheath was placed. The dilator was
removed and the sheath was flushed.

Once the sheath was in place, red marrow injection was performed,
confirming a narrowing of the iliac artery. Given that this would be
impeding the progress of the sheath, potentially being occlusive, 6
mm balloon angioplasty was performed.

Omni Flush catheter was then advanced to the abdominal aorta.
Angiogram was performed of the pelvic arteries.

Combination of the Omni Flush catheter and a C2 Cobra catheter used
to navigate a Glidewire into the right external iliac artery. With
the Glidewire position distally, the Cobra catheter was advanced
into the common femoral artery. Glidewire was removed and a rose in
wire was placed. Catheter was removed.

Full heparinization was performed.

Six French 45 cm terumo destination sheath was placed over the
bifurcation into the external iliac artery. Angiogram of the right
lower extremity was performed to the proximal tibial arteries.

90 cm Navicross catheter and a Glidewire were used to engage the
stump of the native SFA.

Never cross catheter advanced into the mid thigh, encountered
calcifications within the mid thigh. Wire was removed and injection
was performed demonstrating either subintimal or extravascular
position of the catheter.

The never cross catheter was not able to be navigated into a more
confident channel in the native SFA. Therefore we elected to
puncture the anterior tibial artery and work backwards.

1% lidocaine was used to anesthetize the dorsum of the right foot.
Micropuncture was used to access the dorsalis pedis. 018 V18 wire
was passed retrograde through the dorsalis pedis and distal anterior
tibial artery. A wire position in the distal anterior tibial artery
was achieved and the needle was removed. An 018 crossing catheter
was advanced bare back, with attempt at pushing the crossing
catheter and wire retrograde.

A combination of first and 018 command wire and then an 014
Glidewire were used to find the anterior tibial artery channel
within the proximal third of the artery. Ultimately the quick cross
catheter was impeded by calcifications in the tibial artery, without
enough support to push the Glidewire retrograde. Exchange was then
made again for the 014 command wire. The 014 command wire entered
the proximal anterior tibial artery. We then upsized the crossing
catheter to a 90 cm quick cross catheter. We were then successful
with the 018 wire and the 035 catheter with advancing the catheter
to the popliteal artery.

The goal at this point was finding a similar channel between the
catheter from above and the catheter from below. An 014 wire was
passed retrograde through the length of the SFA. 2 mm balloon
angioplasty was then performed along the entire length of the
occluded anterior tibial artery, popliteal artery, and SFA. We were
unable to run day view the wire from below with the never cross
catheter from above, likely because of 2 subintimal planes.

Rose in wire was passed through the antegrade crossing catheter with
5 mm balloon angioplasty performed in the adductor canal.

We then again attempted to rendezvous the microwire from below with
the crossing catheter from above. This was unsuccessful given 2
different planes.

Ultimately we push the crossing catheter from the dorsalis pedis
access retrograde into the common femoral artery and the external
iliac artery. We were then successfully able to retrograde pass the
command wire through the never cross catheter and externalized the
wire at the left common femoral access.

At this point, 5 mm balloon angioplasty was performed along the
entire length of the SFA, popliteal artery.

We then initiated stenting with placement of a 5 mm x 80 mm Supera
stent. This was extended proximally with a 5 mm by 100 mm viabahn
graft. We finally extended this with a 6 mm by 250 mm viabahn graft.
6 mm balloon angioplasty was performed as a post dilation along the
entire length of the stent graft system as well as at the proximal
superficial femoral artery.

At this point, an 683856 cm crossing catheter was passed distally to
the anterior tibial artery and the dorsalis pedis puncture site. The
014 command wire was removed and a 300 cm whisper wire was placed
antegrade through the catheter. The catheter was removed.

Balloon angioplasty was then performed along the length of the
anterior tibial artery with a 3 mm by 220 mm coyote balloon.

Balloon catheter was removed and a final angiogram was performed of
the entire right lower extremity.

Pulse was checked at the right ankle confirming restoration of the
pulse.

We then withdrew all catheters and wires, placed a Bentson wire into
the left common femoral access site and deployed a 6 [place]
Angio-Seal.

Patient tolerated the procedure well and remained hemodynamically
stable throughout.

No complications were encountered and no significant blood loss.
FINDINGS: Initial angiogram confirms a right SFA/popliteal artery occlusion,
with no significant flow into the tibial arteries.

After treatment of the chronic total occlusion of the SFA and
popliteal artery as well as treatment of the occluded anterior
tibial artery there is restoration of inline flow to the foot.

0% flow at the beginning of the case with 100% improved perfusion at
the conclusion.
IMPRESSION: Status post ultrasound-guided access left common femoral artery and
right dorsalis pedis for balloon angioplasty and stenting of right
femoropopliteal chronic total occlusion, balloon angioplasty of
occluded right anterior tibial artery, restoring in-line flow to the
foot for treatment of cli.

Angio-Seal deployed for hemostasis.

## 2021-11-29 IMAGING — DX DG CHEST 1V PORT
1 series · 1 of 1 positions shown · non-contrast
Comparison: 08/25/2020

CLINICAL DATA: Sepsis.  Necrotic toes

EXAM:
PORTABLE CHEST 1 VIEW

[chest ap]
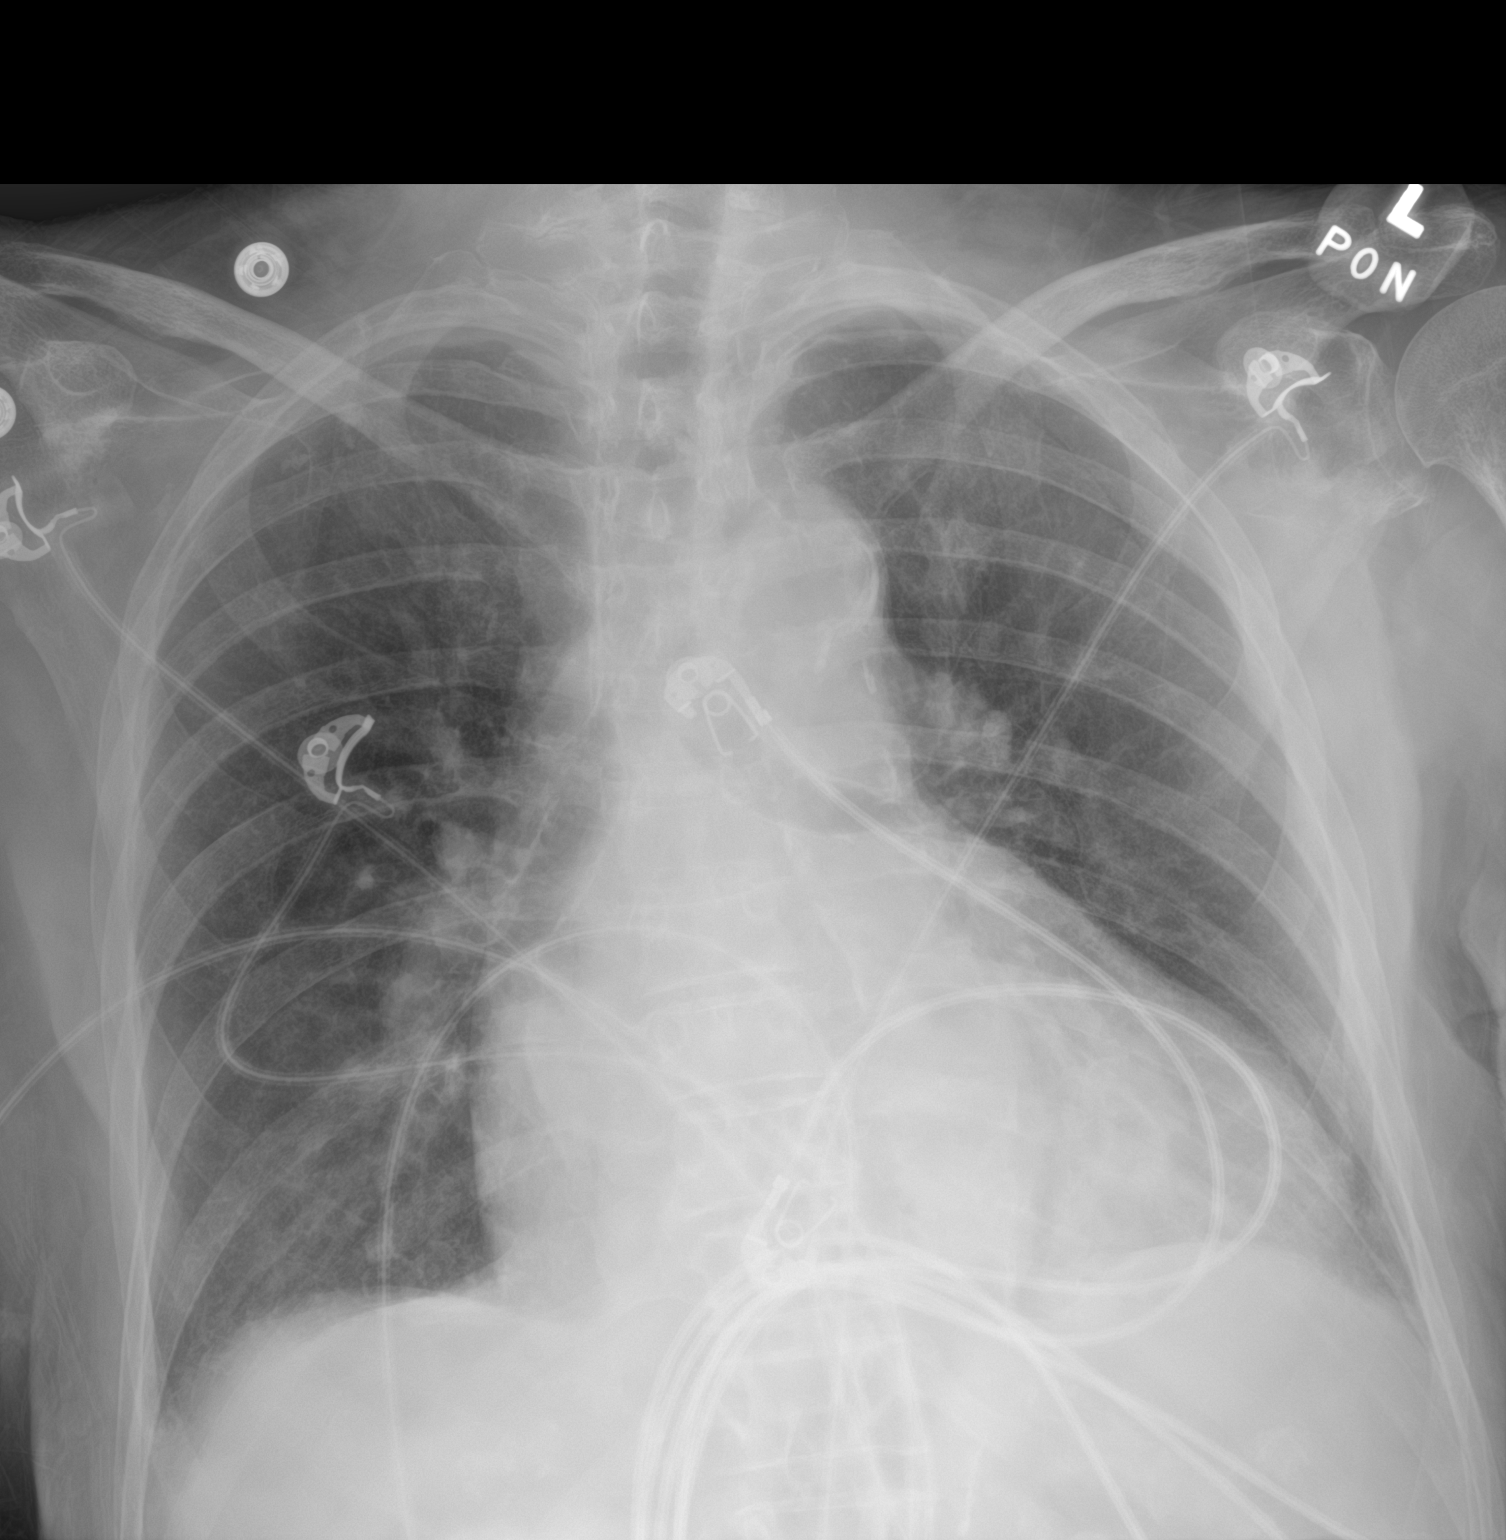

[1 of 1 positions shown; findings below may reference images not displayed]

FINDINGS: Stable enlarged cardiac silhouette. No focal infiltrate. No pleural
fluid. No pneumothorax. Mild central venous congestion. No acute
osseous abnormality.
IMPRESSION: Cardiomegaly and central venous congestion. No infiltrate
identified.
